# Patient Record
Sex: Female | Born: 1965
Health system: Southern US, Community
[De-identification: ages and names within clinical notes are randomized; demographics above are authoritative.]

## PROBLEM LIST (undated history)

## (undated) DIAGNOSIS — Z87442 Personal history of urinary calculi: Secondary | ICD-10-CM

## (undated) DIAGNOSIS — M1990 Unspecified osteoarthritis, unspecified site: Secondary | ICD-10-CM

## (undated) DIAGNOSIS — R519 Headache, unspecified: Secondary | ICD-10-CM

## (undated) DIAGNOSIS — Z8489 Family history of other specified conditions: Secondary | ICD-10-CM

## (undated) HISTORY — PX: DILATION AND CURETTAGE OF UTERUS: SHX78

## (undated) HISTORY — PX: COLONOSCOPY: SHX174

---

## 2000-09-26 ENCOUNTER — Other Ambulatory Visit: Admission: RE | Admit: 2000-09-26 | Discharge: 2000-09-26 | Payer: Self-pay | Admitting: Obstetrics and Gynecology

## 2000-12-19 ENCOUNTER — Encounter: Admission: RE | Admit: 2000-12-19 | Discharge: 2000-12-19 | Payer: Self-pay | Admitting: Internal Medicine

## 2001-05-24 ENCOUNTER — Ambulatory Visit (HOSPITAL_COMMUNITY): Admission: RE | Admit: 2001-05-24 | Discharge: 2001-05-24 | Payer: Self-pay | Admitting: Obstetrics and Gynecology

## 2001-05-24 ENCOUNTER — Encounter: Payer: Self-pay | Admitting: Obstetrics and Gynecology

## 2001-10-04 ENCOUNTER — Other Ambulatory Visit: Admission: RE | Admit: 2001-10-04 | Discharge: 2001-10-04 | Payer: Self-pay | Admitting: Obstetrics and Gynecology

## 2001-11-16 ENCOUNTER — Encounter: Payer: Self-pay | Admitting: Obstetrics and Gynecology

## 2001-11-16 ENCOUNTER — Ambulatory Visit (HOSPITAL_COMMUNITY): Admission: RE | Admit: 2001-11-16 | Discharge: 2001-11-16 | Payer: Self-pay | Admitting: Obstetrics and Gynecology

## 2001-11-24 ENCOUNTER — Ambulatory Visit (HOSPITAL_COMMUNITY): Admission: RE | Admit: 2001-11-24 | Discharge: 2001-11-24 | Payer: Self-pay | Admitting: Obstetrics and Gynecology

## 2001-11-24 ENCOUNTER — Encounter: Payer: Self-pay | Admitting: Obstetrics and Gynecology

## 2002-12-21 ENCOUNTER — Encounter: Admission: RE | Admit: 2002-12-21 | Discharge: 2002-12-21 | Payer: Self-pay | Admitting: Internal Medicine

## 2004-02-26 ENCOUNTER — Other Ambulatory Visit: Admission: RE | Admit: 2004-02-26 | Discharge: 2004-02-26 | Payer: Self-pay | Admitting: Obstetrics and Gynecology

## 2004-05-11 ENCOUNTER — Ambulatory Visit (HOSPITAL_COMMUNITY): Admission: RE | Admit: 2004-05-11 | Discharge: 2004-05-11 | Payer: Self-pay | Admitting: Obstetrics and Gynecology

## 2004-10-18 ENCOUNTER — Inpatient Hospital Stay (HOSPITAL_COMMUNITY): Admission: AD | Admit: 2004-10-18 | Discharge: 2004-10-20 | Payer: Self-pay | Admitting: Obstetrics and Gynecology

## 2008-05-14 ENCOUNTER — Ambulatory Visit (HOSPITAL_COMMUNITY): Admission: RE | Admit: 2008-05-14 | Discharge: 2008-05-14 | Payer: Self-pay | Admitting: Obstetrics and Gynecology

## 2009-05-08 ENCOUNTER — Ambulatory Visit: Payer: Self-pay | Admitting: Sports Medicine

## 2009-05-08 DIAGNOSIS — M25561 Pain in right knee: Secondary | ICD-10-CM

## 2009-05-08 DIAGNOSIS — M25562 Pain in left knee: Secondary | ICD-10-CM

## 2010-11-09 ENCOUNTER — Ambulatory Visit (HOSPITAL_COMMUNITY)
Admission: RE | Admit: 2010-11-09 | Discharge: 2010-11-09 | Payer: Self-pay | Source: Home / Self Care | Attending: Obstetrics and Gynecology | Admitting: Obstetrics and Gynecology
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
## 2011-04-09 NOTE — Op Note (Signed)
[facility]  Patient:    Jennifer Casey, Jennifer Casey Visit Number: [id number] MRN: [id number]          Service Type: Attending:  Maris Berger. Pennie Rushing, M.D. Dictated by:   Maris Berger. Pennie Rushing, M.D. Proc. Date: 11/24/01                             Operative Report  PREOPERATIVE DIAGNOSES:       Intrauterine pregnancy at [redacted] weeks gestation, maternal age 45, abnormal AFP.  POSTOPERATIVE DIAGNOSES:      Intrauterine pregnancy at [redacted] weeks gestation, maternal age 33, abnormal AFP.  OPERATION:                    Genetic amniocentesis.  ANESTHESIA:                   Local.  ESTIMATED BLOOD LOSS:         Less than 5 cc.  COMPLICATIONS:                None.  FINDINGS:                     There was a posterior placenta and normal amniotic fluid.  A detailed ultrasound had been performed on November 16, 2001 showing normal anatomy.  PROCEDURE:                    A discussion had been held with the patient and her husband concerning the indications for her procedure as well as the risks involved on December 26 when an attempted amniocentesis was undertaken.  The patient presents today for repeat attempt at amniocentesis.  A preliminary screening ultrasound is performed and a pocket of fluid that is devoid of fetal parts and cord was identified which could be accessed to the left of midline at the level of the umbilicus.  This area was prepped with multiple layers of Betadine and draped as a sterile field.  Infiltration with 5 cc of 1% Xylocaine was undertaken.  A 20 gauge spinal needle was used to access the amniotic fluid pocket on a single stick and 5 cc of clear yellow fluid was removed into the first syringe and 10 cc of clear fluid into a second syringe. The pre amniocentesis heart rate was 140 beats per minute.  The post amniocentesis heart rate was noted to be 143 beats per minute.  The amniocentesis site was documented by ultrasound and the amniocentesis  needle removed.  The patient tolerated the procedure well.  The fluid was sent to [facility] for evaluation.  The patient was given written instructions concerning post amniocentesis follow-up. Dictated by:   Maris Berger. Pennie Rushing, M.D. Attending:  Maris Berger. Pennie Rushing, M.D. DD:  11/24/01 TD:  11/24/01 Job: [id number]

## 2011-04-09 NOTE — Op Note (Signed)
[facility]  Patient:    Jennifer Casey, Jennifer Casey Visit Number: [id number] MRN: [id number]          Service Type: Attending:  Maris Berger. Pennie Rushing, M.D. Dictated by:   Maris Berger. Pennie Rushing, M.D. Proc. Date: 11/16/01                             Operative Report  PREOPERATIVE DIAGNOSIS:       Intrauterine pregnancy at [redacted] weeks gestation, abnormal alpha-fetoprotein, increased Downs syndrome risk, maternal age 57.  POSTOPERATIVE DIAGNOSIS:      Intrauterine pregnancy at [redacted] weeks gestation, abnormal alpha-fetoprotein, increased Downs syndrome risk, maternal age 45.  OPERATION:                    Attempted amniocentesis for genetic purposes but without the procurement of amniotic fluid.  SURGEON:                      Vanessa P. Pennie Rushing, M.D.  ANESTHESIA:                   Local.  ESTIMATED BLOOD LOSS:         Less than 10 cc.  COMPLICATIONS:                Tenting of the amnion which precluded the entry into the amniotic fluid space.  DESCRIPTION OF PROCEDURE:     The patient underwent an alpha-fetoprotein in our office on 11/01/01.  It returned with increased Downs syndrome risk of 1 in 195.  A long discussion was held with the patient and her husband concerning the implications of this finding as well as the recommendation for amniocentesis based on the patients age alone; then with an increase in the age-related risk of 1 in 380 to 1 in 195 based on her alpha-fetoprotein and free beta hCG.  A detailed ultrasound was performed and a posterior placenta documented.  An adequate fluid pocket just to the right of midline, approximately 3 cm above the pubis was identified.  The area was cleansed with Betadine and infiltrated with 1% Xylocaine.  Using a 20-gauge Ultraview needle, the aforementioned amniotic sac space was sought after but never entered.  Visualization in real time ultrasound showed the tip of the needle to be directly adjacent to the fluid pocket;  however, no fluid egressed. After trying to position the needle on several occasions, but finding the fetus too close to the needle track, a decision was made to remove that needle and to reevaluate the uterus for another fluid pocket.  This was done and fluid pocket identified to the left of midline and slightly above the previously identified spot.  This area again was cleansed with Betadine and infiltrated with 1% Xylocaine.  Using a 20-gauge Ultraview needle, the attempt was made to access that pocket; however, even though the real time ultrasound documented that the needle was directly adjacent to the amniotic fluid pocket, a membrane could be visualized which tented down into the amniotic sac. Attempts to break through that membrane into the amniotic cavity were unsuccessful and a decision was made to end the procedure with removal of the amniocentesis needle after documentation of its placement was done on ultrasound.  At that time a discussion was held with the patient and her husband concerning rescheduling the amniocentesis for 11/24/01 at 7 a.m. and this was agreeable to them.  The post amniocentesis  heart rate was within the normal limit of 120-160.  There were no immediate complications of the two attempts at amniocentesis.  Blood type is A positive. Dictated by:   Maris Berger. Pennie Rushing, M.D. Attending:  Maris Berger. Pennie Rushing, M.D. DD:  11/16/01 TD:  11/17/01 Job: [id number]

## 2011-12-21 ENCOUNTER — Other Ambulatory Visit (HOSPITAL_COMMUNITY): Payer: Self-pay | Admitting: Obstetrics and Gynecology

## 2011-12-21 DIAGNOSIS — Z1231 Encounter for screening mammogram for malignant neoplasm of breast: Secondary | ICD-10-CM

## 2012-01-28 ENCOUNTER — Ambulatory Visit (HOSPITAL_COMMUNITY)
Admission: RE | Admit: 2012-01-28 | Discharge: 2012-01-28 | Disposition: A | Discharge status: Home / Self Care | Payer: 59 | Source: Ambulatory Visit | Attending: Obstetrics and Gynecology | Admitting: Obstetrics and Gynecology

## 2012-01-28 DIAGNOSIS — Z1231 Encounter for screening mammogram for malignant neoplasm of breast: Secondary | ICD-10-CM | POA: Insufficient documentation

## 2013-07-04 ENCOUNTER — Other Ambulatory Visit: Payer: Self-pay | Admitting: Obstetrics and Gynecology

## 2013-07-04 DIAGNOSIS — Z1231 Encounter for screening mammogram for malignant neoplasm of breast: Secondary | ICD-10-CM

## 2013-07-18 ENCOUNTER — Ambulatory Visit (HOSPITAL_COMMUNITY)
Admission: RE | Admit: 2013-07-18 | Discharge: 2013-07-18 | Disposition: A | Discharge status: Home / Self Care | Payer: 59 | Source: Ambulatory Visit | Attending: Obstetrics and Gynecology | Admitting: Obstetrics and Gynecology

## 2013-07-18 ENCOUNTER — Other Ambulatory Visit: Payer: Self-pay | Admitting: Obstetrics and Gynecology

## 2013-07-18 DIAGNOSIS — Z1231 Encounter for screening mammogram for malignant neoplasm of breast: Secondary | ICD-10-CM | POA: Insufficient documentation

## 2015-04-08 ENCOUNTER — Other Ambulatory Visit (HOSPITAL_COMMUNITY): Payer: Self-pay | Admitting: Obstetrics and Gynecology

## 2015-04-08 DIAGNOSIS — Z1231 Encounter for screening mammogram for malignant neoplasm of breast: Secondary | ICD-10-CM

## 2015-04-09 ENCOUNTER — Encounter: Payer: Self-pay | Admitting: Sports Medicine

## 2015-04-09 ENCOUNTER — Ambulatory Visit (INDEPENDENT_AMBULATORY_CARE_PROVIDER_SITE_OTHER): Payer: 59 | Admitting: Sports Medicine

## 2015-04-09 VITALS — BP 118/66 | Ht 59.0 in | Wt 170.0 lb

## 2015-04-09 DIAGNOSIS — M7522 Bicipital tendinitis, left shoulder: Secondary | ICD-10-CM

## 2015-04-09 DIAGNOSIS — M7051 Other bursitis of knee, right knee: Secondary | ICD-10-CM | POA: Diagnosis not present

## 2015-04-09 DIAGNOSIS — M7052 Other bursitis of knee, left knee: Secondary | ICD-10-CM | POA: Diagnosis not present

## 2015-04-09 MED ORDER — METHYLPREDNISOLONE 4 MG PO TBPK: ORAL_TABLET | Freq: Every morning | ORAL | Status: DC

## 2015-04-09 MED ORDER — NITROGLYCERIN 0.2 MG/HR TD PT24: MEDICATED_PATCH | TRANSDERMAL | Status: DC

## 2015-04-09 NOTE — Patient Instructions (Signed)
It was nice to meet you. Please email me the pictures on your bike.  Review of exercises start with 2 sets of 10 per day and working her way up to 3 sets of 15.   Take the Medrol Dosepak beginning next week prior to your trip.   Nitroglycerin Protocol  Apply 1/4 nitroglycerin patch to affected area daily.  Change position of patch within the affected area every 24 hours.  You may experience a headache during the first 1-2 weeks of using the patch, these should subside.  If you experience headaches after beginning nitroglycerin patch treatment, you may take your preferred over the counter pain reliever.  Another side effect of the nitroglycerin patch is skin irritation or rash related to patch adhesive.  Please notify our office if you develop more severe headaches or rash, and stop the patch.  Tendon healing with nitroglycerin patch may require 12 to 24 weeks depending on the extent of injury.  Men should not use if taking Viagra, Cialis, or Levitra.   Do not use if you have migraines or rosacea.

## 2015-04-09 NOTE — Progress Notes (Signed)
DOSHA BROSHEARS - 49 y.o. female MRN [id number]  Date of birth: [birth year]  SUBJECTIVE: CC: 1. Medial knee pain, bilateral 2. Left anterior shoulder pain      HPI:   persistent long-standing medial knee pain that is worsened with cycling.  Patient purchased a new bike and was fit at the [employer] store last year and reported minimal improvement in her symptoms. She is unable to stand bike. Significant medial knee pain and weakness.  She denies any numbness or tingling or pain radiating down her legs. Pain is focally located over bilateral medial knees.  Left shoulder has been bothering her for 2-3 months. She's tried taking over-the-counter anti-inflammatories with only minimal improvement. This did seem to help bilateral knee pain short-lived.  Left arm pain does not radiate down her arm. It is worse with overhead motion lifting straight out in front of her.   She has been working with a Physiological scientist as sometimes exacerbates both her symptoms.      ROS:   denies fevers, chills, night sweats or weight loss   HISTORY:  Past Medical, Surgical, Social, and Family History reviewed & updated per EMR.  Pertinent Historical Findings include: Social History   Occupational History  . Not on file.   Social History Main Topics  . Smoking status: Never Smoker   . Smokeless tobacco: Not on file  . Alcohol Use: Not on file  . Drug Use: Not on file  . Sexual Activity: Not on file    No specialty comments available. Problem  Biceps Tendinitis  Pes Anserinus Bursitis of Both Knees   OBJECTIVE:  VS:   HT:4\' 11"  (149.9 cm)   WT:170 lb (77.111 kg)  BMI:34.4          BP:118/66 mmHg  HR: bpm  TEMP: ( )  RESP:   PHYSICAL EXAM: GENERAL:  adult Caucasian female no acute distress  PSYCH: Alert and appropriately interactive.  SKIN: No open skin lesions or abnormal skin markings on areas inspected as below  VASCULAR:  DP and PT pulses 2+/4. Bilateral radial pulses 2+/4. No significant  pretibial edema.   NEURO: Lower extremity strength is 5+/5 in all myotomes; sensation is intact to light touch in all dermatomes. Upper extremity strength is 5+/5 in all myotomes; sensation is intact to light touch in all dermatomes. Bilateral negative Spurling's compression test. Negative Lhermitte's compression test   LEFT SHOULDER: Overall well aligned. TTP over bicipital groove. No atrophy. Full overhead range of motion full and symmetric internal rotation and external rotation. Negative empty can test, Luan Pulling, Neers. Positive speeds. Negative Yergason's. Minimal pain with resisted supination. No distal biceps tenderness BILATERAL KNEES: Overall well aligned. VMOs poorly defined. Extensor mechanism intact. No effusion. ROM: 0-120 Stable to varus and valgus strain, anterior posterior drawer. Marked tenderness palpation over bilateral medial knees. No significant joint line tenderness. Negative McMurray's.  DATA OBTAINED: No notes on file LIMITED MSK ULTRASOUND OF LEFT SHOULDER: Findings:  Biceps tendon with hypoechoic fluid surrounding it. No significant splitting. Subscapularis intact, supraspinatus intact, infraspinatus and teres minor intact. No significant AC joint arthropathy. Impression: Biceps tendinopathy  ASSESSMENT & PLAN: See problem based charting & AVS for additional documentation Problem List Items Addressed This Visit    Pes anserinus bursitis of both knees - Primary    Chronic, long-standing. Only minimal improvement with recent bike fit the was recommended she increase her saddle height by over a centimeter. They're slowly increasing her leg. Recommend further increasing her seat height and  likely shifting her saddle forward.  She will email me a picture of her bike position to see if I can make any further recommendations. > Consider: Fit at E3 Steroid dose pack for her upcoming [place] trip.  HEP: Quad sets, hamstring strengthening, adductor strengthening.      Relevant  Medications   methylPREDNISolone (MEDROL DOSEPAK) 4 MG TBPK tablet   nitroGLYCERIN (NITRODUR - DOSED IN MG/24 HR) 0.2 mg/hr patch   Biceps tendinitis    Ultrasound evidence of biceps tendinopathy. Chronic. Nitroglycerin protocol. Discussed history of migraine headaches and discontinuation if any difficulties. Palm up wagon wheel exercises, supination and pronation exercises. Repeat scan in 6 weeks.          FOLLOW UP:  Return in about 6 weeks (around 05/21/2015).

## 2015-04-10 DIAGNOSIS — M7520 Bicipital tendinitis, unspecified shoulder: Secondary | ICD-10-CM | POA: Insufficient documentation

## 2015-04-10 NOTE — Assessment & Plan Note (Signed)
Ultrasound evidence of biceps tendinopathy. Chronic. Nitroglycerin protocol. Discussed history of migraine headaches and discontinuation if any difficulties. Palm up wagon wheel exercises, supination and pronation exercises. Repeat scan in 6 weeks.

## 2015-04-10 NOTE — Assessment & Plan Note (Addendum)
Chronic, long-standing. Only minimal improvement with recent bike fit the was recommended she increase her saddle height by over a centimeter. They're slowly increasing her leg. Recommend further increasing her seat height and likely shifting her saddle forward.  She will email me a picture of her bike position to see if I can make any further recommendations. > Consider: Fit at E3 Steroid dose pack for her upcoming [place] trip.  HEP: Quad sets, hamstring strengthening, adductor strengthening.

## 2015-04-11 ENCOUNTER — Ambulatory Visit (HOSPITAL_COMMUNITY)
Admission: RE | Admit: 2015-04-11 | Discharge: 2015-04-11 | Disposition: A | Discharge status: Home / Self Care | Payer: 59 | Source: Ambulatory Visit | Attending: Obstetrics and Gynecology | Admitting: Obstetrics and Gynecology

## 2015-04-11 DIAGNOSIS — Z1231 Encounter for screening mammogram for malignant neoplasm of breast: Secondary | ICD-10-CM | POA: Diagnosis present

## 2015-05-21 ENCOUNTER — Ambulatory Visit (INDEPENDENT_AMBULATORY_CARE_PROVIDER_SITE_OTHER): Payer: 59 | Admitting: Sports Medicine

## 2015-05-21 ENCOUNTER — Encounter: Payer: Self-pay | Admitting: Sports Medicine

## 2015-05-21 VITALS — BP 142/80

## 2015-05-21 DIAGNOSIS — M7052 Other bursitis of knee, left knee: Secondary | ICD-10-CM | POA: Diagnosis not present

## 2015-05-21 DIAGNOSIS — M7522 Bicipital tendinitis, left shoulder: Secondary | ICD-10-CM

## 2015-05-21 DIAGNOSIS — M7051 Other bursitis of knee, right knee: Secondary | ICD-10-CM | POA: Diagnosis not present

## 2015-05-21 MED ORDER — MELOXICAM 15 MG PO TABS: 15.0000 mg | ORAL_TABLET | Freq: Every day | ORAL | Status: DC | PRN

## 2015-05-21 NOTE — Patient Instructions (Signed)
It was good to see you.  Changes to exercises: Palm up wagon wheel @ 0, 45, 90degrees with 2-3# weight Hip ADDuction exercises - side laying - up and hold while laying completely flat  Watch your BP and decrease the Meloxicam to as needed - hopefully 2-3Xs/week at the most

## 2015-05-23 NOTE — Progress Notes (Signed)
Jennifer Casey - 49 y.o. female MRN [id number]  Date of birth: 1966/03/24  VS:   HT:    WT:   BMI:           BP:(!) 142/80 mmHg  HR: bpm  TEMP: ( )  RESP:    Dr. Algis Downs Wife CHIEF COMPLAINT: #1 bilateral knee pain #2 left shoulder pain #3 Elevated Blood pressure issues   HISTORY OF PRESENT ILLNESS: Overall both knees as well as left shoulder significant improvement since her last office visit. She's been performing therapeutic exercises for her left shoulder as well as bilateral knees and is reported marked improvement. She tolerated her trip to [place] well and was able to ride the majority of today's without significant difficulty or discomfort. She did not take prednisone dosepak prescribed but has been taking meloxicam once daily for the past 3 weeks and reports significant help with this. She has had recent travel with her daughter bilateral lower extremity swelling as well as been on meloxicam. Blood pressure is elevated today but denies any chest pain shortness breath or new or different symptoms. She's not had issues with blood pressure in the past.  REVIEW OF SYSTEMS: Review of systems per history of present illness  HISTORY: Reviewed per HPI Non smoker  PHYSICAL EXAM: Objective bilateral knees overall well aligned she has a trace effusion bilaterally pain over the medial joint line as well as bilateral pain over pes bursa. Slight pain with bilateral patellar grind. No significant patellar facet pain no significant patellar tendon pain. Overall normal range of motion She does have pain with resisted hip Adduction exercises that localizes to the area she is sore over the bilateral medial knees. Left shoulder overall well aligned minimal pain pain over the anterior aspect of the shoulder bicipital groove. Pain with resisted biceps testing no pain with Yergason's Neers or O'Brien's testing. Strength is 5 out of 5.  ASSESSMENT: Encounter Diagnoses  Name Primary?  . Pes  anserinus bursitis of both knees Yes  . Biceps tendinitis, left     #1 Bilateral knee pain consistent with bilateral basilar answered bursitis significantly improved since last office visit #2 Left biceps tendinitis/tendinopathy  PLAN: For her left shoulder with her continue with nitroglycerin protocol and therapeutic exercises. We'll have her modified using Palm up exercises she's been doing palm down. The wrist supination/pronation exercises seem to exacerbate her wrist pain so we will have her hold off on this.  For her bilateral knees did review hip adduction exercises with her today. I do suspect some this is coming from bike fit position and underlying muscle imbalances that she is improving and she will try to send picture again, from a more direct approach from the side to check for knee over spindle placement as I suspect she may be too far backwards however it is difficult to tell from pictures previously sent.  Blood pressure is slightly elevated upon recheck it was minimally improved. This is likely due to anti-inflammatory effects and have recommended she decreased to taking this on a when necessary basis and watching her sodium intake. If she has persistent elevation she's discontinue anti-inflammatory to follow-up with her PCP for further evaluation. She will follow-up with sports medicine clinic or myself [facility] in 6-8 weeks for clinical recheck and consideration of formal bike fit analysis at that time.  > Return in about 6 weeks (around 07/02/2015).   Meds ordered this encounter  Medications  . meloxicam (MOBIC) 15 MG tablet  Sig: Take 1 tablet (15 mg total) by mouth daily as needed for pain.    Dispense:  30 tablet    Refill:  1    No orders of the defined types were placed in this encounter.

## 2015-10-08 ENCOUNTER — Ambulatory Visit (INDEPENDENT_AMBULATORY_CARE_PROVIDER_SITE_OTHER): Payer: 59 | Admitting: Sports Medicine

## 2015-10-08 ENCOUNTER — Encounter: Payer: Self-pay | Admitting: Sports Medicine

## 2015-10-08 VITALS — BP 112/57 | HR 81 | Ht 59.0 in | Wt 170.0 lb

## 2015-10-08 DIAGNOSIS — R269 Unspecified abnormalities of gait and mobility: Secondary | ICD-10-CM | POA: Insufficient documentation

## 2015-10-08 DIAGNOSIS — M7052 Other bursitis of knee, left knee: Secondary | ICD-10-CM | POA: Diagnosis not present

## 2015-10-08 DIAGNOSIS — M25562 Pain in left knee: Secondary | ICD-10-CM | POA: Diagnosis not present

## 2015-10-08 DIAGNOSIS — M25561 Pain in right knee: Secondary | ICD-10-CM

## 2015-10-08 DIAGNOSIS — M7051 Other bursitis of knee, right knee: Secondary | ICD-10-CM

## 2015-10-08 NOTE — Assessment & Plan Note (Addendum)
The bilateral knee pain I think is biomechanical She has dynamic genu valgus related to her foot breakdown and her gait abnormality  She should use topical Voltaren at least last daily Occasional ibuprofen since too much bumps her blood pressure  Try a compression sleeve on her knee for support and use bilaterally if helpful

## 2015-10-08 NOTE — Patient Instructions (Signed)
This is triggered by biomechanical change You do not have weakness of the muscle groups  Your flattened arch puts stress on the medial knee and creates pes anserine bursitis or tendinitis  We will put arch support in shoes and cont to wear regular shoes with good arch  Use the voltaren gel consistently at least twice daily  Use the compression sleeve for hiking or prolonged standing to add some support - at the gym - but not necessary with biking  Biking helps so don't avoid this but check to see that you are in neutral position  On days you aggravate add the oral ibuprofen  If compression helps you may want to get a second one

## 2015-10-08 NOTE — Assessment & Plan Note (Signed)
I added scaphoid pads  We should try these and also use shoes with good arches  This does improve her gait  If these do not help enough we can move custom orthotics

## 2015-10-08 NOTE — Progress Notes (Signed)
Patient ID: Jennifer Casey, female   DOB: 1965/12/02, 49 y.o.   MRN: [id number]  Patient returns for her bilateral knee pain This was felt to be related to pes anserine bursitis Ultrasound scans were not that remarkable She has been resting from biking but that did not improve the knee pain Adjustment of bike seat did not make a change as well  The left shoulder has improved some She continues working with Physiological scientist  Of interest she got almost complete relief of her medial knee pain when she took the Mobic However this increased her blood pressure and cause some ankle swelling so she had to stop  Review of systems No fever or chills No recent infections No swelling of any joints No skin rashes  Physical exam Pleasant lady in no acute distress BP 112/57 mmHg  Pulse 81  Ht 4\' 11"  (1.499 m)  Wt 170 lb (77.111 kg)  BMI 34.32 kg/m2  Hip abduction strength is good Quadriceps strength is good  Knee Left and right Normal to inspection with no erythema or effusion or obvious bony abnormalities. Palpation normal with no warmth or joint line tenderness or patellar tenderness or condyle tenderness. ROM normal in flexion and extension and lower leg rotation. Ligaments with solid consistent endpoints including ACL, PCL, LCL, MCL. Negative Mcmurray's and provocative meniscal tests. Non painful patellar compression. Patellar and quadriceps tendons unremarkable. Hamstring and quadriceps strength is normal.  Standing she has collapse of the longitudinal arch and significant pronation She gets a drop of her midfoot  On walking gait and this gives her dynamic genu valgus and internal rotation of the upper tibia

## 2015-11-12 ENCOUNTER — Other Ambulatory Visit: Payer: Self-pay | Admitting: *Deleted

## 2015-11-12 MED ORDER — KETOPROFEN POWD: Status: DC

## 2016-04-22 DIAGNOSIS — Z1231 Encounter for screening mammogram for malignant neoplasm of breast: Secondary | ICD-10-CM | POA: Diagnosis not present

## 2016-04-22 DIAGNOSIS — N904 Leukoplakia of vulva: Secondary | ICD-10-CM | POA: Diagnosis not present

## 2016-04-22 DIAGNOSIS — Z01411 Encounter for gynecological examination (general) (routine) with abnormal findings: Secondary | ICD-10-CM | POA: Diagnosis not present

## 2016-04-29 ENCOUNTER — Encounter: Payer: Self-pay | Admitting: Sports Medicine

## 2016-04-29 ENCOUNTER — Ambulatory Visit (INDEPENDENT_AMBULATORY_CARE_PROVIDER_SITE_OTHER): Payer: 59 | Admitting: Sports Medicine

## 2016-04-29 VITALS — BP 112/96 | HR 75 | Ht 59.0 in | Wt 175.0 lb

## 2016-04-29 DIAGNOSIS — M25561 Pain in right knee: Secondary | ICD-10-CM

## 2016-04-29 DIAGNOSIS — M25562 Pain in left knee: Secondary | ICD-10-CM

## 2016-04-29 NOTE — Assessment & Plan Note (Signed)
Bilateral knee pain seems biomechanical Proper bike fit is key Advised on exercises to strengthen quads (see AVS) Likely with mild early arthritic changes noted on medial femoral condyles that are irritated by crossed knee sitting Advised more frequent position changes F/u prn

## 2016-04-29 NOTE — Patient Instructions (Signed)
Try Arnica gel instead of ketoprofen.  Exercises to strengthen quads and stabilize patella - wall sits - mini squats on each leg - 15 reps  Switch up sitting positions at least every 2 hours  Make sure bike fit is right and not hurting knees.

## 2016-04-29 NOTE — Progress Notes (Signed)
  Subjective:   Jennifer Casey is a 50 y.o. female with a history of b/l knee pain here for f/u of knee pain.  Patient was last seen 09/2015 and knee pain was thought to be related to mechanical strain and pes anserine bursitis.  [place] scans previously unremarkable.  Patient reports that since having bike fit adjusted 3-4 weeks ago, knee pain has greatly improved.  She feels as though b/l feet were inverting while she was biking and putting strain on knees. Feet are now locked in straight on bike.  She is currently biking 20 mi/ride and has ridden ~8 times in last 3 wks. During her last 3 rides, her knees have not hurt at all.  She is still intermittently waking in the middle of the night with b/l knee pain. Knee pain continues to be in b/l medial knees.    Pain is worse after sitting with legs crossed for multiple hours when working from home.  She was using ketoprofen cream on b/l knees for ~6 months and it was helping.  After picking up a new batch, she noted red, itchy rash over both knees within 1 hr of applying the cream.  Pharmacist told her there was no change in products to make this batch.  Of note, she took Mobic in the past and had complete relief of knee pain, but stopped it after having ankle swelling and increased BP.  Review of Systems:  Per HPI. No fevers, chills, swelling, rash  Social History: never smoker  Objective:  BP 112/96 mmHg  Pulse 75  Ht 4\' 11"  (1.499 m)  Wt 175 lb (79.379 kg)  BMI 35.33 kg/m2  Gen:  50 y.o. female in NAD, pleasant, well appearing MSK: L and R knee: Normal to inspection with no erythema or effusion or obvious bony abnormalities. Palpation normal with no warmth. Mild TTP over medial joint lines ROM normal in flexion and extension and lower leg rotation. Ligaments with solid consistent endpoints including ACL, PCL, LCL, MCL. Negative Mcmurray's and provocative meniscal tests. Non painful patellar compression. Patellar and quadriceps tendons  unremarkable. Hamstring and quadriceps strength is normal. Normal hip abduction and adduction strength. No gross sensory defects With single leg squats, dynamic genu valgus deformity noted with pain noted in medial knee  [place]: small bone spurs noted on R and L medial femoral condyles. Menisci intact. No other abnormalities      Assessment & Plan:     Jennifer Casey is a 50 y.o. female here for  Knee pain, bilateral Bilateral knee pain seems biomechanical Proper bike fit is key Advised on exercises to strengthen quads (see AVS) Likely with mild early arthritic changes noted on medial femoral condyles that are irritated by crossed knee sitting Advised more frequent position changes F/u prn       Virginia Crews, MD MPH PGY-2,  [facility] 04/29/2016  2:16 PM     Agree with assessment and plan.  Ila Mcgill, MD

## 2016-07-28 DIAGNOSIS — M79645 Pain in left finger(s): Secondary | ICD-10-CM | POA: Diagnosis not present

## 2016-08-19 ENCOUNTER — Other Ambulatory Visit: Payer: Self-pay | Admitting: Sports Medicine

## 2016-10-25 DIAGNOSIS — L900 Lichen sclerosus et atrophicus: Secondary | ICD-10-CM | POA: Insufficient documentation

## 2016-10-25 DIAGNOSIS — N904 Leukoplakia of vulva: Secondary | ICD-10-CM | POA: Diagnosis not present

## 2016-11-01 MED FILL — CLOBETASOL 0.05% OINTMENT: 0.05 | 20 days supply | Qty: 15 | Fill #0

## 2016-12-20 DIAGNOSIS — Z0000 Encounter for general adult medical examination without abnormal findings: Secondary | ICD-10-CM | POA: Diagnosis not present

## 2016-12-20 DIAGNOSIS — N904 Leukoplakia of vulva: Secondary | ICD-10-CM | POA: Diagnosis not present

## 2017-04-12 MED FILL — GAVILYTE-N SOLUTION: 420 | 2 days supply | Qty: 4000 | Fill #0

## 2017-04-14 DIAGNOSIS — Z1211 Encounter for screening for malignant neoplasm of colon: Secondary | ICD-10-CM | POA: Diagnosis not present

## 2017-04-27 DIAGNOSIS — Z01411 Encounter for gynecological examination (general) (routine) with abnormal findings: Secondary | ICD-10-CM | POA: Diagnosis not present

## 2017-04-27 DIAGNOSIS — L900 Lichen sclerosus et atrophicus: Secondary | ICD-10-CM | POA: Diagnosis not present

## 2017-04-27 DIAGNOSIS — Z1231 Encounter for screening mammogram for malignant neoplasm of breast: Secondary | ICD-10-CM | POA: Diagnosis not present

## 2017-04-27 DIAGNOSIS — Z6836 Body mass index (BMI) 36.0-36.9, adult: Secondary | ICD-10-CM | POA: Diagnosis not present

## 2017-04-27 DIAGNOSIS — Z780 Asymptomatic menopausal state: Secondary | ICD-10-CM | POA: Diagnosis not present

## 2017-11-24 MED FILL — CLOBETASOL 0.05% OINTMENT: 0.05 | 7 days supply | Qty: 15 | Fill #0

## 2017-12-26 ENCOUNTER — Ambulatory Visit
Admission: RE | Admit: 2017-12-26 | Discharge: 2017-12-26 | Disposition: A | Discharge status: Home / Self Care | Payer: 59 | Source: Ambulatory Visit | Attending: Family Medicine | Admitting: Family Medicine

## 2017-12-26 ENCOUNTER — Other Ambulatory Visit: Payer: Self-pay | Admitting: Family Medicine

## 2017-12-26 DIAGNOSIS — L729 Follicular cyst of the skin and subcutaneous tissue, unspecified: Secondary | ICD-10-CM

## 2018-04-03 DIAGNOSIS — Z136 Encounter for screening for cardiovascular disorders: Secondary | ICD-10-CM | POA: Diagnosis not present

## 2018-04-03 DIAGNOSIS — Z0000 Encounter for general adult medical examination without abnormal findings: Secondary | ICD-10-CM | POA: Diagnosis not present

## 2018-04-03 DIAGNOSIS — N904 Leukoplakia of vulva: Secondary | ICD-10-CM | POA: Diagnosis not present

## 2018-04-03 DIAGNOSIS — Z131 Encounter for screening for diabetes mellitus: Secondary | ICD-10-CM | POA: Diagnosis not present

## 2018-05-18 DIAGNOSIS — Z1231 Encounter for screening mammogram for malignant neoplasm of breast: Secondary | ICD-10-CM | POA: Diagnosis not present

## 2018-05-18 DIAGNOSIS — Z01411 Encounter for gynecological examination (general) (routine) with abnormal findings: Secondary | ICD-10-CM | POA: Diagnosis not present

## 2018-05-18 DIAGNOSIS — Z6837 Body mass index (BMI) 37.0-37.9, adult: Secondary | ICD-10-CM | POA: Diagnosis not present

## 2018-05-18 DIAGNOSIS — L900 Lichen sclerosus et atrophicus: Secondary | ICD-10-CM | POA: Diagnosis not present

## 2018-11-03 DIAGNOSIS — H52203 Unspecified astigmatism, bilateral: Secondary | ICD-10-CM | POA: Diagnosis not present

## 2018-11-03 DIAGNOSIS — H524 Presbyopia: Secondary | ICD-10-CM | POA: Diagnosis not present

## 2018-11-13 DIAGNOSIS — R05 Cough: Secondary | ICD-10-CM | POA: Diagnosis not present

## 2018-11-13 DIAGNOSIS — B349 Viral infection, unspecified: Secondary | ICD-10-CM | POA: Diagnosis not present

## 2019-01-25 ENCOUNTER — Encounter: Payer: Self-pay | Admitting: Sports Medicine

## 2019-01-25 ENCOUNTER — Ambulatory Visit: Payer: 59 | Admitting: Sports Medicine

## 2019-01-25 ENCOUNTER — Ambulatory Visit: Payer: Self-pay

## 2019-01-25 VITALS — BP 136/78 | Ht 59.0 in | Wt 180.0 lb

## 2019-01-25 DIAGNOSIS — M25511 Pain in right shoulder: Secondary | ICD-10-CM

## 2019-01-25 NOTE — Progress Notes (Signed)
Jennifer Casey - 53 y.o. female MRN [id number]  Date of birth: 1966/02/19    SUBJECTIVE:      Chief Complaint: Right shoulder pain.  Bilateral knee pain  HPI:  53 year old female presents with right shoulder pain for 5 months and bilateral knee pain for 6 years. Patient reports anterior right shoulder pain.  She denies any specific injury.  She reports occasional "popping" at the anterior shoulder with movement which is somewhat new.  Her pain is made worse with bicep curls and forward flexion at the shoulder.  She has also had to alter her exercising due to this pain.  She notes some radiation into the bicep.  She gets relief with ice and Motrin.  She takes Motrin 200 mg a few times a week.  Her bilateral knee pain has been present for 6 years.  Is located over the anterior medial aspect of the knee.  She was evaluated for this initially and was felt to have peds anserine bursitis.Marland Kitchen  She was treated conservatively at that time but has that with some level of pain for the majority of the past 6 years.  She denies any knee swelling or erythema.  No locking or feelings of instability.   ROS:     See HPI  PERTINENT  PMH / PSH FH / / SH:  Past Medical, Surgical, Social, and Family History Reviewed & Updated in the EMR.    OBJECTIVE: BP 136/78   Ht 4\' 11"  (1.499 m)   Wt 180 lb (81.6 kg)   BMI 36.36 kg/m   Physical Exam:  Vital signs are reviewed.  GEN: Alert and oriented, NAD Pulm: Breathing unlabored PSY: normal mood, congruent affect  MSK: Right shoulder: No obvious deformity or asymmetry. No bruising. No swelling Tenderness over the proximal biceps tendon Full ROM in flexion, abduction, internal/external rotation NV intact distally Special Tests:  - Impingement: Neg Hawkins and Neers.  - Supraspinatus: Negative empty can.  5/5 strength - Infraspinatus/Teres: 5/5 strength with ER - Subscapularis: negative belly press, 5/5 strength with IR - Biceps tendon: Positive speeds.   Mildly positive Yergason's  MSK [place]: Ultrasound of right shoulder  BT short: normal appearance, no tears, no tenosynovitis BT long: normal appearance, intact without tears Supraspinatus tendon: normal appearance, no tears of degenerative changes, no abnormal fluid within the bursa Subscapularis tendon: normal appearance without tears or degenerative changes Infraspinatus tendon:  normal appearance without tears or degenerative changes Teres Minor tendon:  normal appearance without tears or degenerative changes GH joint: normal appearing posterior labrum AC joint: mild degenerative changes  Summary and Additional findings-mild AC joint arthritis otherwise unremarkable ultrasound of the right shoulder.  No rotator cuff pathology.  No biceps tenosynovitis.  Ultrasound and interpretation by Coralyn Helling, DO and Wolfgang Phoenix. Fields, MD   Left shoulder: No obvious deformity No tenderness over the proximal biceps tendon Full range of motion  5/5 strength with rotator cuff testing  Bilateral knees: - Inspection: no gross deformity. No swelling/effusion, erythema or bruising. Skin intact - Palpation: Tenderness bilaterally over the medial anterior knee.  Point maximal tenderness is over the peds anserine bursa bilaterally.  She does have some tenderness over the medial joint line as well - ROM: full active ROM with flexion and extension in knee  Genu recurvatum of both knees - Strength: 5/5 strength - Neuro/vasc: NV intact - Special Tests: - LIGAMENTS: negative Lachman's, no MCL or LCL laxity laterally -- MENISCUS: negative McMurray's bilaterally  MSK [place]: Limited ultrasound  of the bilateral knees: The right medial joint line shows very mild spurring.  The visualized portion of the meniscus is intact without tears or degenerative changes.  The right has anserine bursa appears slightly fluid-filled.  The left past anserine bursa appears slightly fluid-filled as well.  Both of these areas  correspond highly to the patient's area of pain.    ASSESSMENT & PLAN:  1.  Right shoulder pain-secondary to biceps tendinitis.  Ultrasound shows no focal tears or tenosynovitis.  Based on exam, patient does have symptoms consistent with tendinitis. - Patient instructed on home exercises/rehab for the biceps tendon - Continue ibuprofen as needed -Follow-up in 6 weeks  2.  Bilateral knee pain secondary to peds anserine bursitis - Patient started on home exercises/rehab for the sartorius, adductors, and semimembranosus/tendinosis. - If pain remains significant, consider steroid injections -Follow-up in 6 weeks  I observed and examined the patient with the [facility] Fellow and agree with assessment and plan.  Note reviewed and modified by me. Andreas Blower, MD

## 2019-05-15 DIAGNOSIS — E669 Obesity, unspecified: Secondary | ICD-10-CM | POA: Diagnosis not present

## 2019-05-15 DIAGNOSIS — N904 Leukoplakia of vulva: Secondary | ICD-10-CM | POA: Diagnosis not present

## 2019-05-15 DIAGNOSIS — Z0000 Encounter for general adult medical examination without abnormal findings: Secondary | ICD-10-CM | POA: Diagnosis not present

## 2019-06-12 DIAGNOSIS — L900 Lichen sclerosus et atrophicus: Secondary | ICD-10-CM | POA: Diagnosis not present

## 2019-06-12 DIAGNOSIS — Z124 Encounter for screening for malignant neoplasm of cervix: Secondary | ICD-10-CM | POA: Diagnosis not present

## 2019-06-12 DIAGNOSIS — Z6834 Body mass index (BMI) 34.0-34.9, adult: Secondary | ICD-10-CM | POA: Diagnosis not present

## 2019-06-12 DIAGNOSIS — Z0000 Encounter for general adult medical examination without abnormal findings: Secondary | ICD-10-CM | POA: Diagnosis not present

## 2019-06-12 DIAGNOSIS — Z8041 Family history of malignant neoplasm of ovary: Secondary | ICD-10-CM | POA: Diagnosis not present

## 2019-06-12 DIAGNOSIS — Z1239 Encounter for other screening for malignant neoplasm of breast: Secondary | ICD-10-CM | POA: Diagnosis not present

## 2019-06-12 DIAGNOSIS — Z1211 Encounter for screening for malignant neoplasm of colon: Secondary | ICD-10-CM | POA: Diagnosis not present

## 2019-06-12 DIAGNOSIS — Z01419 Encounter for gynecological examination (general) (routine) without abnormal findings: Secondary | ICD-10-CM | POA: Diagnosis not present

## 2019-06-12 MED FILL — CLOBETASOL PROP 0.05% OINT: 0.05 | 20 days supply | Qty: 15 | Fill #0

## 2019-06-15 DIAGNOSIS — Z1231 Encounter for screening mammogram for malignant neoplasm of breast: Secondary | ICD-10-CM | POA: Diagnosis not present

## 2019-06-15 DIAGNOSIS — Z0000 Encounter for general adult medical examination without abnormal findings: Secondary | ICD-10-CM | POA: Diagnosis not present

## 2019-06-22 MED FILL — VITAMIN D3 50000 UNIT CAPS: 1.25 MG | 56 days supply | Qty: 8 | Fill #0

## 2019-07-18 DIAGNOSIS — N83201 Unspecified ovarian cyst, right side: Secondary | ICD-10-CM | POA: Diagnosis not present

## 2019-07-18 DIAGNOSIS — Z842 Family history of other diseases of the genitourinary system: Secondary | ICD-10-CM | POA: Diagnosis not present

## 2019-07-18 DIAGNOSIS — Z8041 Family history of malignant neoplasm of ovary: Secondary | ICD-10-CM | POA: Diagnosis not present

## 2019-07-18 DIAGNOSIS — D259 Leiomyoma of uterus, unspecified: Secondary | ICD-10-CM | POA: Diagnosis not present

## 2019-07-19 DIAGNOSIS — Z8489 Family history of other specified conditions: Secondary | ICD-10-CM | POA: Insufficient documentation

## 2019-08-27 ENCOUNTER — Other Ambulatory Visit: Payer: Self-pay

## 2019-08-27 ENCOUNTER — Ambulatory Visit (INDEPENDENT_AMBULATORY_CARE_PROVIDER_SITE_OTHER): Payer: 59 | Admitting: Sports Medicine

## 2019-08-27 DIAGNOSIS — M7712 Lateral epicondylitis, left elbow: Secondary | ICD-10-CM

## 2019-08-27 DIAGNOSIS — M7710 Lateral epicondylitis, unspecified elbow: Secondary | ICD-10-CM | POA: Insufficient documentation

## 2019-08-27 NOTE — Assessment & Plan Note (Signed)
Patient with TTP along the lateral epicondyl with symptoms and ultrasound findings consistent with tennis elbow. - Cont Motrin scheduled for the next 7-10 days along with ice and activity modification - Compression sleeve as needed during activities - Home exercises given, f/u in 6-8 weeks if no improvement

## 2019-08-27 NOTE — Progress Notes (Signed)
     Subjective: HPI: Jennifer Casey is a 53 y.o. presenting to clinic today to discuss the following:  Left Elbow Pain Patient is a 53y/o female with PMH of right biceps tendonitis who presents today for lateral elbow pain that started 4-5 weeks ago. It is a sharp intermittent pain that sometimes radiates to the dorsal side of her hand. She takes Motrin as needed and ices it when it hurts which helps with her symptoms. She never felt a "pop" or "snap" and has not experienced any weakness, numbness, or tingling sensation. She has been riding her bike more lately and working with a Physiological scientist.  ROS noted in HPI.    Social History   Tobacco Use  Smoking Status Never Smoker  Smokeless Tobacco Never Used    Objective: BP 124/86   Ht 4\' 11"  (1.499 m)   Wt 170 lb (77.1 kg)   BMI 34.34 kg/m  Vitals and nursing notes reviewed  Physical Exam Elbow, Left: Patient reports TTP at the lateral epicondyle. Inspection yields no evidence of bony deformity, effusion, erythema, ecchymosis, or rash. Active and passive ROM intact in flexion/extension/supination/pronation. Strength 5/5 throughout. No TTP at the medial epicondyle. Pain with finger/wrist extension against resistance. Pain with gripping and wrist extension, no pain with wrist flexion against resistance. No evidence of pain or laxity at the UCL.  Left Elbow U/S: Hypoechoic changes of the ECRB noted at the lateral epicondyl in long axis view along with bone spur on the lateral epicondyl proximal to the joint line. No signs of ECRB tear.  Assessment/Plan:  Lateral epicondylitis (tennis elbow) Patient with TTP along the lateral epicondyl with symptoms and ultrasound findings consistent with tennis elbow. - Cont Motrin scheduled for the next 7-10 days along with ice and activity modification - Compression sleeve as needed during activities - Home exercises given, f/u in 6-8 weeks if no improvement  PATIENT EDUCATION PROVIDED: See  AVS    Diagnosis and plan along with any newly prescribed medication(s) were discussed in detail with this patient today. The patient verbalized understanding and agreed with the plan. Patient advised if symptoms worsen return to clinic or ER.    Harolyn Rutherford, DO 08/27/2019, 4:00 PM PGY-3 [facility]  Patient seen and evaluated with the resident.  I agree with the above plan of care.  Ultrasound shows hypoechoic changes within the common extensor tendon consistent with lateral epicondylitis.  Treatment as above.  If symptoms persist consider topical nitroglycerin and formal physical therapy.  Follow-up for ongoing or recalcitrant issues.

## 2019-08-27 NOTE — Patient Instructions (Signed)
Tennis Elbow Tennis elbow is swelling (inflammation) in your outer forearm, near your elbow. Swelling affects the tissues that connect muscle to bone (tendons). Tennis elbow can happen in any sport or job in which you use your elbow too much. It is caused by doing the same motion over and over. Tennis elbow can cause:  Pain and tenderness in your forearm and the outer part of your elbow. You may have pain all the time, or only when using the arm.  A burning feeling. This runs from your elbow through your arm.  Weak grip in your hand. Follow these instructions at home: Activity  Rest your elbow and wrist. Avoid activities that cause problems, as told by your doctor.  If told by your doctor, wear an elbow strap to reduce stress on the area.  Do physical therapy exercises as told.  If you lift an object, lift it with your palm facing up. This is easier on your elbow. Lifestyle  If your tennis elbow is caused by sports, check your equipment and make sure that: ? You are using it correctly. ? It fits you well.  If your tennis elbow is caused by work or by using a computer, take breaks often to stretch your arm. Talk with your manager about how you can manage your condition at work. If you have a brace:  Wear the brace as told by your doctor. Remove it only as told by your doctor.  Loosen the brace if your fingers tingle, get numb, or turn cold and blue.  Keep the brace clean.  If the brace is not waterproof, ask your doctor if you may take the brace off for bathing. If you must keep the brace on while bathing: ? Do not let it get wet. ? Cover it with a watertight covering when you take a bath or a shower. General instructions   If told, put ice on the painful area: ? Put ice in a plastic bag. ? Place a towel between your skin and the bag. ? Leave the ice on for 20 minutes, 2-3 times a day.  Take over-the-counter and prescription medicines only as told by your doctor.  Keep  all follow-up visits as told by your doctor. This is important. Contact a doctor if:  Your pain does not get better with treatment.  Your pain gets worse.  You have weakness in your forearm, hand, or fingers.  You cannot feel your forearm, hand, or fingers. Summary  Tennis elbow is swelling (inflammation) in your outer forearm, near your elbow.  Tennis elbow is caused by doing the same motion over and over.  Rest your elbow and wrist. Avoid activities that cause problems, as told by your doctor.  If told, put ice on the painful area for 20 minutes, 2-3 times a day. This information is not intended to replace advice given to you by your health care provider. Make sure you discuss any questions you have with your health care provider. Document Released: 04/28/2010 Document Revised: 08/04/2018 Document Reviewed: 08/23/2017 Elsevier Patient Education  2020 [employer]  

## 2019-10-29 DIAGNOSIS — E559 Vitamin D deficiency, unspecified: Secondary | ICD-10-CM | POA: Diagnosis not present

## 2019-10-29 DIAGNOSIS — N83201 Unspecified ovarian cyst, right side: Secondary | ICD-10-CM | POA: Diagnosis not present

## 2019-10-30 DIAGNOSIS — K130 Diseases of lips: Secondary | ICD-10-CM | POA: Diagnosis not present

## 2019-10-30 DIAGNOSIS — L308 Other specified dermatitis: Secondary | ICD-10-CM | POA: Diagnosis not present

## 2019-10-30 MED FILL — DESONIDE 0.05% OINTMENT: 0.05 | 14 days supply | Qty: 15 | Fill #0

## 2019-11-07 MED FILL — VITAMIN D3 50000 UNIT CAPS: 1.25 MG | 56 days supply | Qty: 8 | Fill #0

## 2019-12-12 DIAGNOSIS — D2272 Melanocytic nevi of left lower limb, including hip: Secondary | ICD-10-CM | POA: Diagnosis not present

## 2020-01-02 DIAGNOSIS — E559 Vitamin D deficiency, unspecified: Secondary | ICD-10-CM | POA: Diagnosis not present

## 2020-01-07 DIAGNOSIS — Z8041 Family history of malignant neoplasm of ovary: Secondary | ICD-10-CM | POA: Diagnosis not present

## 2020-01-07 DIAGNOSIS — K580 Irritable bowel syndrome with diarrhea: Secondary | ICD-10-CM | POA: Diagnosis not present

## 2020-02-06 DIAGNOSIS — K529 Noninfective gastroenteritis and colitis, unspecified: Secondary | ICD-10-CM | POA: Diagnosis not present

## 2020-05-15 DIAGNOSIS — E559 Vitamin D deficiency, unspecified: Secondary | ICD-10-CM | POA: Diagnosis not present

## 2020-05-15 DIAGNOSIS — Z131 Encounter for screening for diabetes mellitus: Secondary | ICD-10-CM | POA: Diagnosis not present

## 2020-05-15 DIAGNOSIS — Z13220 Encounter for screening for lipoid disorders: Secondary | ICD-10-CM | POA: Diagnosis not present

## 2020-05-15 DIAGNOSIS — E669 Obesity, unspecified: Secondary | ICD-10-CM | POA: Diagnosis not present

## 2020-05-15 DIAGNOSIS — Z0000 Encounter for general adult medical examination without abnormal findings: Secondary | ICD-10-CM | POA: Diagnosis not present

## 2020-08-01 DIAGNOSIS — Z23 Encounter for immunization: Secondary | ICD-10-CM | POA: Diagnosis not present

## 2020-09-25 ENCOUNTER — Other Ambulatory Visit (HOSPITAL_COMMUNITY): Payer: Self-pay | Admitting: Obstetrics and Gynecology

## 2020-09-25 DIAGNOSIS — L900 Lichen sclerosus et atrophicus: Secondary | ICD-10-CM | POA: Diagnosis not present

## 2020-09-25 DIAGNOSIS — Z1211 Encounter for screening for malignant neoplasm of colon: Secondary | ICD-10-CM | POA: Diagnosis not present

## 2020-09-25 DIAGNOSIS — Z01419 Encounter for gynecological examination (general) (routine) without abnormal findings: Secondary | ICD-10-CM | POA: Diagnosis not present

## 2020-09-25 DIAGNOSIS — Z8041 Family history of malignant neoplasm of ovary: Secondary | ICD-10-CM | POA: Diagnosis not present

## 2020-09-25 DIAGNOSIS — Z1239 Encounter for other screening for malignant neoplasm of breast: Secondary | ICD-10-CM | POA: Diagnosis not present

## 2020-09-25 MED FILL — TRIAMCINOLONE 0.1% CREAM: 0.1 | 30 days supply | Qty: 45 | Fill #0

## 2020-10-03 DIAGNOSIS — Z23 Encounter for immunization: Secondary | ICD-10-CM | POA: Diagnosis not present

## 2020-11-03 DIAGNOSIS — Z1231 Encounter for screening mammogram for malignant neoplasm of breast: Secondary | ICD-10-CM | POA: Diagnosis not present

## 2021-06-01 DIAGNOSIS — E559 Vitamin D deficiency, unspecified: Secondary | ICD-10-CM | POA: Diagnosis not present

## 2021-06-01 DIAGNOSIS — E669 Obesity, unspecified: Secondary | ICD-10-CM | POA: Diagnosis not present

## 2021-06-01 DIAGNOSIS — Z13220 Encounter for screening for lipoid disorders: Secondary | ICD-10-CM | POA: Diagnosis not present

## 2021-06-01 DIAGNOSIS — M7071 Other bursitis of hip, right hip: Secondary | ICD-10-CM | POA: Diagnosis not present

## 2021-06-01 DIAGNOSIS — Z23 Encounter for immunization: Secondary | ICD-10-CM | POA: Diagnosis not present

## 2021-06-01 DIAGNOSIS — Z0000 Encounter for general adult medical examination without abnormal findings: Secondary | ICD-10-CM | POA: Diagnosis not present

## 2021-06-22 ENCOUNTER — Other Ambulatory Visit: Payer: Self-pay | Admitting: Family Medicine

## 2021-06-22 DIAGNOSIS — R109 Unspecified abdominal pain: Secondary | ICD-10-CM | POA: Diagnosis not present

## 2021-06-22 DIAGNOSIS — R102 Pelvic and perineal pain: Secondary | ICD-10-CM

## 2021-06-25 ENCOUNTER — Other Ambulatory Visit: Payer: 59

## 2021-06-25 ENCOUNTER — Other Ambulatory Visit: Payer: Self-pay

## 2021-06-25 ENCOUNTER — Ambulatory Visit (HOSPITAL_COMMUNITY)
Admission: RE | Admit: 2021-06-25 | Discharge: 2021-06-25 | Disposition: A | Discharge status: Home / Self Care | Payer: 59 | Source: Ambulatory Visit | Attending: Family Medicine | Admitting: Family Medicine

## 2021-06-25 DIAGNOSIS — R102 Pelvic and perineal pain: Secondary | ICD-10-CM

## 2021-06-25 DIAGNOSIS — R109 Unspecified abdominal pain: Secondary | ICD-10-CM | POA: Insufficient documentation

## 2021-06-25 DIAGNOSIS — N858 Other specified noninflammatory disorders of uterus: Secondary | ICD-10-CM | POA: Diagnosis not present

## 2021-06-25 DIAGNOSIS — K7689 Other specified diseases of liver: Secondary | ICD-10-CM | POA: Diagnosis not present

## 2021-06-25 DIAGNOSIS — D251 Intramural leiomyoma of uterus: Secondary | ICD-10-CM | POA: Diagnosis not present

## 2021-07-07 ENCOUNTER — Other Ambulatory Visit: Payer: 59

## 2021-07-20 DIAGNOSIS — K760 Fatty (change of) liver, not elsewhere classified: Secondary | ICD-10-CM | POA: Diagnosis not present

## 2021-08-19 DIAGNOSIS — D2261 Melanocytic nevi of right upper limb, including shoulder: Secondary | ICD-10-CM | POA: Diagnosis not present

## 2021-08-19 DIAGNOSIS — L918 Other hypertrophic disorders of the skin: Secondary | ICD-10-CM | POA: Diagnosis not present

## 2021-08-19 DIAGNOSIS — L814 Other melanin hyperpigmentation: Secondary | ICD-10-CM | POA: Diagnosis not present

## 2021-08-19 DIAGNOSIS — D2271 Melanocytic nevi of right lower limb, including hip: Secondary | ICD-10-CM | POA: Diagnosis not present

## 2021-09-10 DIAGNOSIS — F419 Anxiety disorder, unspecified: Secondary | ICD-10-CM | POA: Diagnosis not present

## 2021-09-23 DIAGNOSIS — F419 Anxiety disorder, unspecified: Secondary | ICD-10-CM | POA: Diagnosis not present

## 2021-11-11 IMAGING — US US ABDOMEN COMPLETE
1 series · 15 of 25 positions shown · non-contrast
Comparison: None.

CLINICAL DATA: Abdominal pain. Palpable nodule in the right upper
quadrant.

EXAM:
ABDOMEN ULTRASOUND COMPLETE

[Series 1: us abdomen complete mc & wl · 15 of 123 slices shown]
[im 1/123]
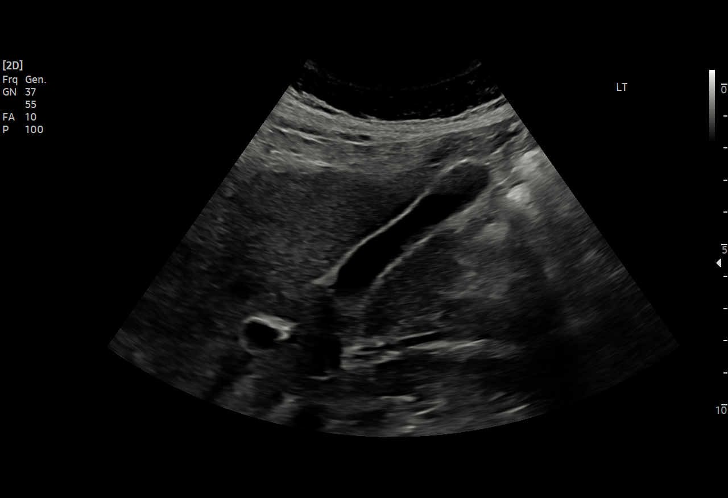
[im 11/123]
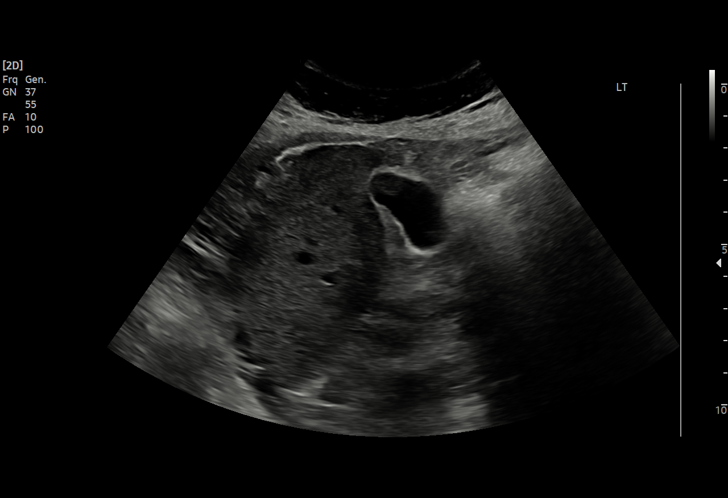
[im 21/123]
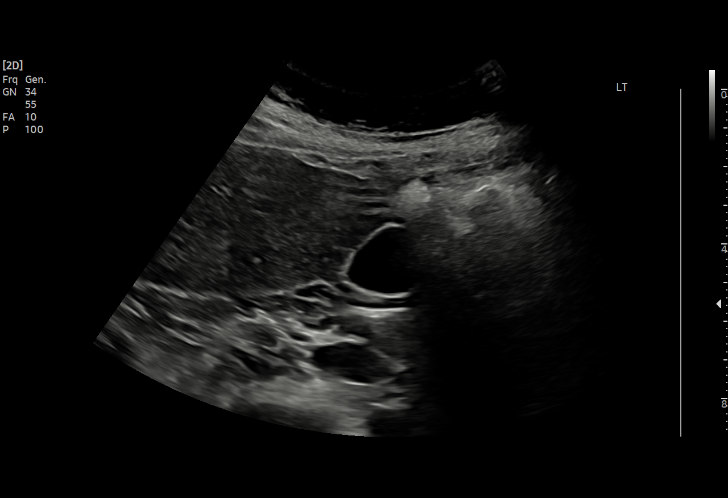
[im 26/123]
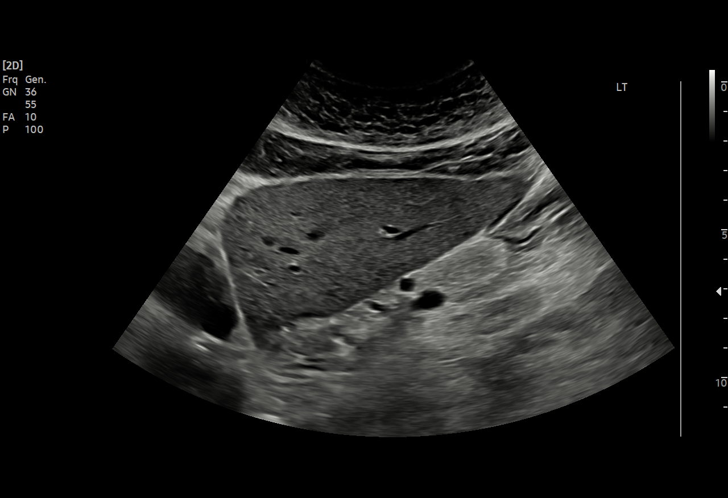
[im 36/123]
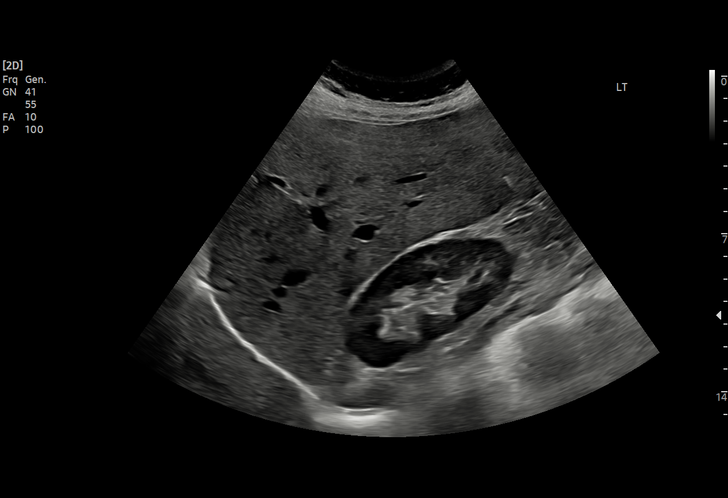
[im 46/123]
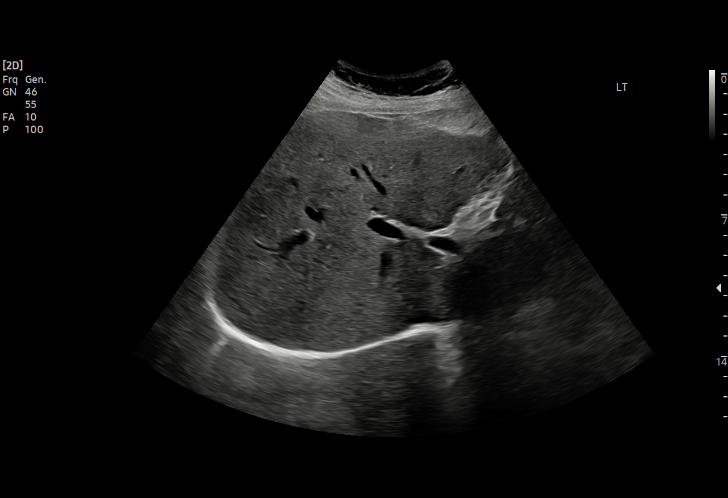
[im 51/123]
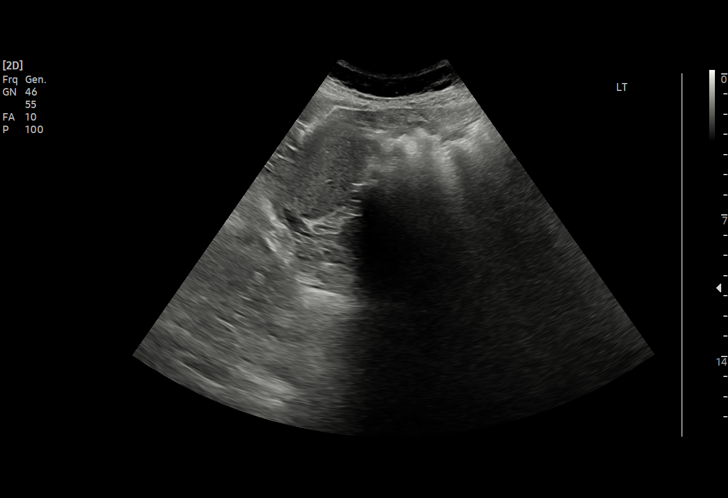
[im 62/123]
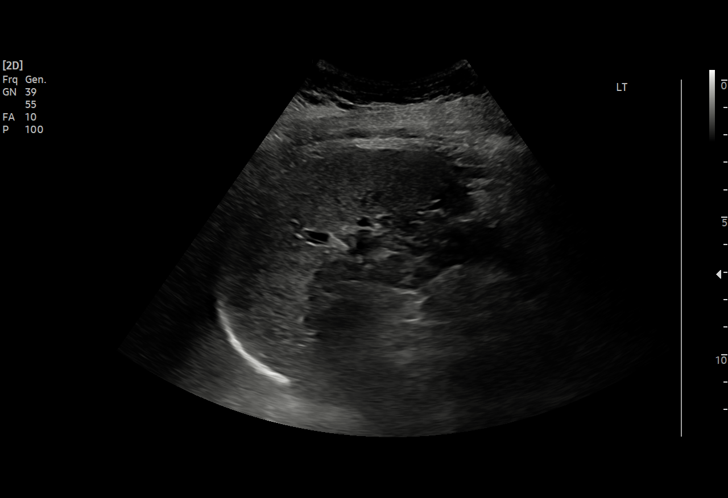
[im 72/123]
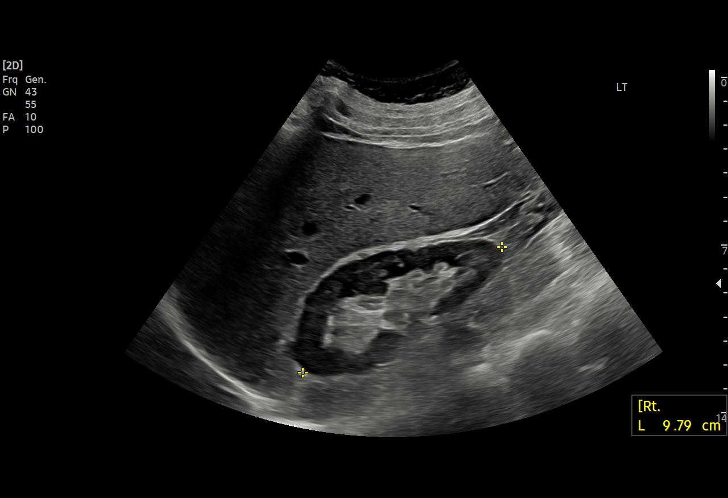
[im 77/123]
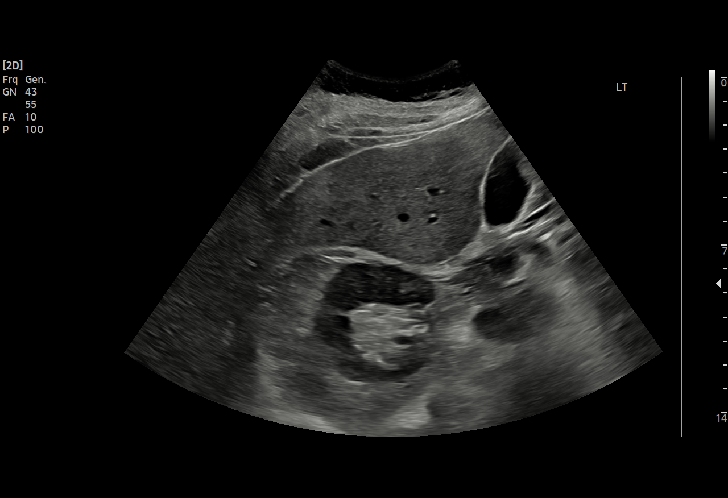
[im 87/123]
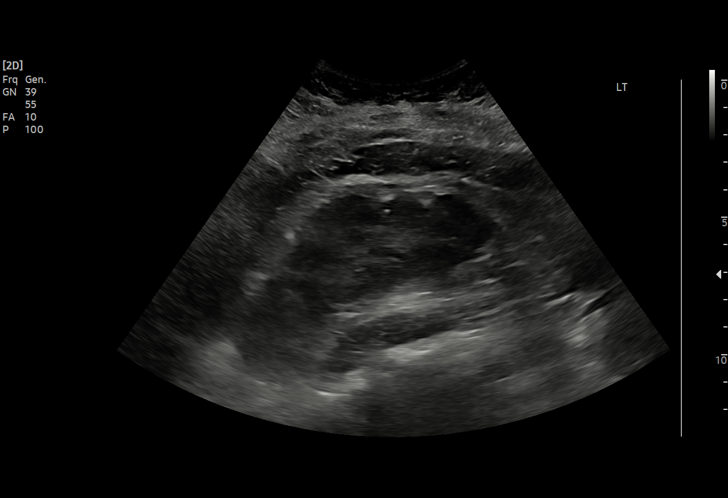
[im 97/123]
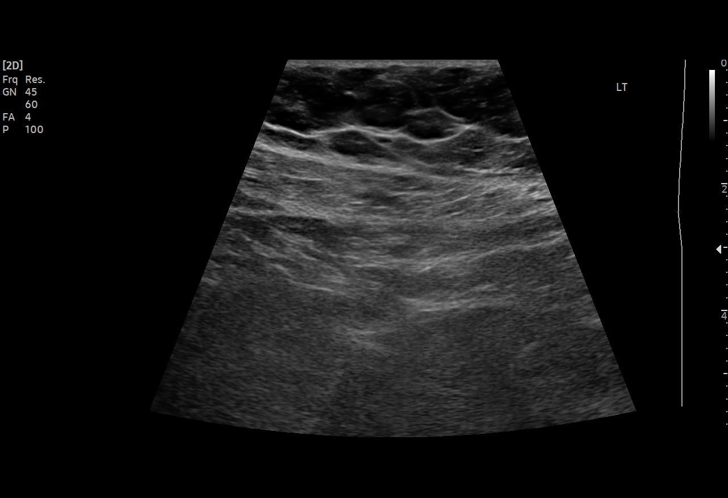
[im 102/123]
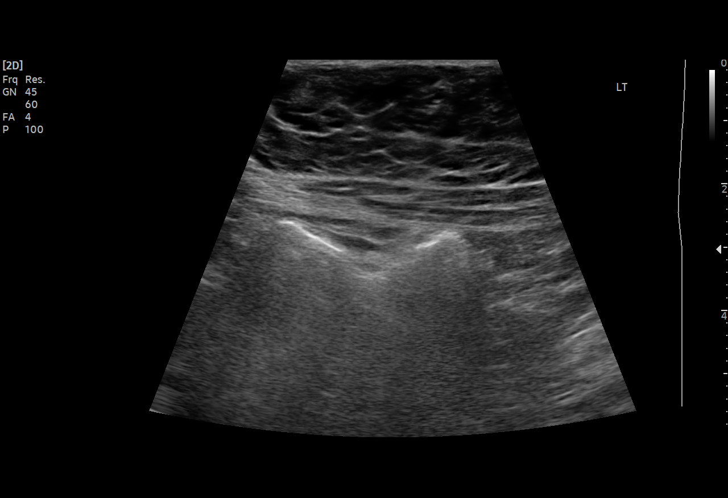
[im 112/123]
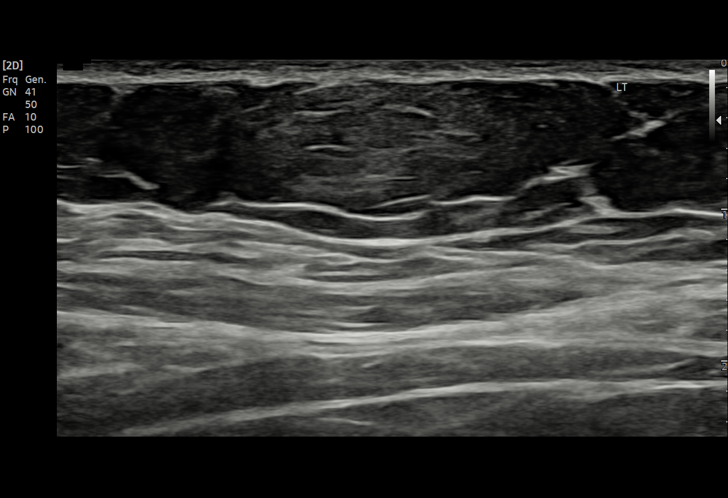
[im 123/123]
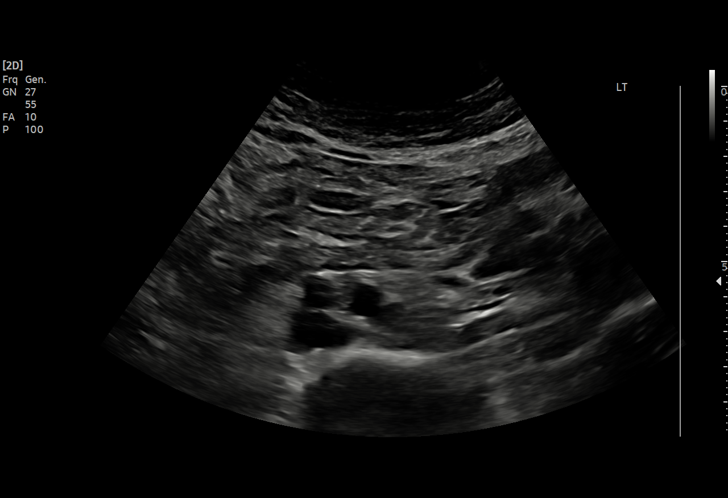

[15 of 25 positions shown; findings below may reference images not displayed]

FINDINGS: Gallbladder: No gallstones or wall thickening visualized. No
sonographic Murphy sign noted by sonographer.

Common bile duct: Diameter: 3.3 mm

Liver: Increased echogenicity in the liver with no focal mass.
Portal vein is patent on color Doppler imaging with normal direction
of blood flow towards the liver.

IVC: No abnormality visualized.

Pancreas: Visualized portion unremarkable.

Spleen: Size and appearance within normal limits.

Right Kidney: Length: 9.8 cm. Echogenicity within normal limits. No
mass or hydronephrosis visualized.

Left Kidney: Length: 9.6 cm. Echogenicity within normal limits. No
mass or hydronephrosis visualized.

Abdominal aorta: No aneurysm visualized.

Other findings: In the right upper quadrant, just below the ribs,
there is a hyperechoic mass measuring 18 x 8 x 17 mm in the
subcutaneous tissues. This is the palpable region felt by the
patient.
IMPRESSION: 1. The hyperechoic mass measuring 18 x 8 x 17 mm in the subcutaneous
region of the patient's symptoms is nonspecific but likely a lipoma.
2. Increased echogenicity in the liver with no focal mass is
nonspecific but often due to hepatic steatosis.

## 2021-12-03 DIAGNOSIS — F419 Anxiety disorder, unspecified: Secondary | ICD-10-CM | POA: Diagnosis not present

## 2022-03-07 ENCOUNTER — Emergency Department (HOSPITAL_COMMUNITY): Payer: 59

## 2022-03-07 ENCOUNTER — Encounter (HOSPITAL_COMMUNITY): Payer: Self-pay

## 2022-03-07 ENCOUNTER — Emergency Department (HOSPITAL_COMMUNITY)
Admission: EM | Admit: 2022-03-07 | Discharge: 2022-03-07 | Disposition: A | Discharge status: Home / Self Care | Payer: 59 | Attending: Emergency Medicine | Admitting: Emergency Medicine

## 2022-03-07 DIAGNOSIS — N23 Unspecified renal colic: Secondary | ICD-10-CM | POA: Diagnosis not present

## 2022-03-07 DIAGNOSIS — N132 Hydronephrosis with renal and ureteral calculous obstruction: Secondary | ICD-10-CM | POA: Diagnosis not present

## 2022-03-07 DIAGNOSIS — D259 Leiomyoma of uterus, unspecified: Secondary | ICD-10-CM | POA: Diagnosis not present

## 2022-03-07 DIAGNOSIS — R109 Unspecified abdominal pain: Secondary | ICD-10-CM | POA: Diagnosis present

## 2022-03-07 DIAGNOSIS — N210 Calculus in bladder: Secondary | ICD-10-CM | POA: Diagnosis not present

## 2022-03-07 LAB — BASIC METABOLIC PANEL
Anion gap: 8 (ref 5–15)
BUN: 24 mg/dL — ABNORMAL HIGH (ref 6–20)
CO2: 26 mmol/L (ref 22–32)
Calcium: 9.2 mg/dL (ref 8.9–10.3)
Chloride: 106 mmol/L (ref 98–111)
Creatinine, Ser: 1.17 mg/dL — ABNORMAL HIGH (ref 0.44–1.00)
GFR, Estimated: 55 mL/min — ABNORMAL LOW (ref >60)
Glucose, Bld: 139 mg/dL — ABNORMAL HIGH (ref 70–99)
Potassium: 3.8 mmol/L (ref 3.5–5.1)
Sodium: 140 mmol/L (ref 135–145)

## 2022-03-07 LAB — CBC WITH DIFFERENTIAL/PLATELET
Abs Immature Granulocytes: 0.03 10*3/uL (ref 0.00–0.07)
Basophils Absolute: 0 10*3/uL (ref 0.0–0.1)
Basophils Relative: 0 %
Eosinophils Absolute: 0.1 10*3/uL (ref 0.0–0.5)
Eosinophils Relative: 1 %
HCT: 41.5 % (ref 36.0–46.0)
Hemoglobin: 14.3 g/dL (ref 12.0–15.0)
Immature Granulocytes: 0 %
Lymphocytes Relative: 13 %
Lymphs Abs: 1.2 10*3/uL (ref 0.7–4.0)
MCH: 31.9 pg (ref 26.0–34.0)
MCHC: 34.5 g/dL (ref 30.0–36.0)
MCV: 92.6 fL (ref 80.0–100.0)
Monocytes Absolute: 0.4 10*3/uL (ref 0.1–1.0)
Monocytes Relative: 4 %
Neutro Abs: 7.3 10*3/uL (ref 1.7–7.7)
Neutrophils Relative %: 82 %
Platelets: 196 10*3/uL (ref 150–400)
RBC: 4.48 MIL/uL (ref 3.87–5.11)
RDW: 12.3 % (ref 11.5–15.5)
WBC: 8.9 10*3/uL (ref 4.0–10.5)
nRBC: 0 % (ref 0.0–0.2)

## 2022-03-07 LAB — URINALYSIS, ROUTINE W REFLEX MICROSCOPIC
Bacteria, UA: NONE SEEN
Bilirubin Urine: NEGATIVE
Glucose, UA: NEGATIVE mg/dL
Hgb urine dipstick: NEGATIVE
Ketones, ur: NEGATIVE mg/dL
Nitrite: NEGATIVE
Protein, ur: NEGATIVE mg/dL
Specific Gravity, Urine: 1.021 (ref 1.005–1.030)
pH: 5 (ref 5.0–8.0)

## 2022-03-07 MED ORDER — ONDANSETRON 8 MG PO TBDP
8.0000 mg | ORAL_TABLET | Freq: Three times a day (TID) | ORAL | 0 refills | Status: DC | PRN
  Filled 2022-03-07: qty 20, 7d supply, fill #0

## 2022-03-07 MED ORDER — SODIUM CHLORIDE 0.9 % IV SOLN: INTRAVENOUS | Status: DC

## 2022-03-07 MED ORDER — OXYCODONE-ACETAMINOPHEN 5-325 MG PO TABS: 1.0000 | ORAL_TABLET | Freq: Four times a day (QID) | ORAL | 0 refills | Status: DC | PRN

## 2022-03-07 MED ORDER — OXYCODONE-ACETAMINOPHEN 5-325 MG PO TABS
1.0000 | ORAL_TABLET | Freq: Four times a day (QID) | ORAL | 0 refills | Status: DC | PRN
  Filled 2022-03-07: qty 15, 2d supply, fill #0

## 2022-03-07 MED ORDER — KETOROLAC TROMETHAMINE 30 MG/ML IJ SOLN
15.0000 mg | Freq: Once | INTRAMUSCULAR | Status: AC
  Administered 2022-03-07: 15 mg via INTRAVENOUS
  Filled 2022-03-07: qty 1

## 2022-03-07 MED ORDER — ONDANSETRON HCL 4 MG/2ML IJ SOLN
4.0000 mg | Freq: Once | INTRAMUSCULAR | Status: AC
  Administered 2022-03-07: 4 mg via INTRAVENOUS
  Filled 2022-03-07: qty 2

## 2022-03-07 NOTE — ED Provider Notes (Signed)
?[facility] DEPT ?Provider Note ? ? ?CSN: [id number] ?Arrival date & time: 03/07/22  1452 ? ?  ? ?History ? ?Chief Complaint  ?Patient presents with  ? Flank Pain  ? Vomiting  ? ? ?ROLLANDE THURSBY is a 56 y.o. female. ? ?55 year old female presents with 24 hours of colicky right-sided flank pain.  Had some associated nausea and vomiting.  No prior history of kidney stones.  Denies any urinary symptoms.  No change in bowel or bladder function.  Does note some slight dysuria.  No treatment use prior to arrival. ? ? ?  ? ?Home Medications ?Prior to Admission medications   ?Medication Sig Start Date End Date Taking? Authorizing Provider  ?diclofenac sodium (VOLTAREN) 1 % GEL  07/31/15   [provider]  ?Ketoprofen POWD APPLY TO AFFECTED AREA THREE TIMES DAILY. ?Patient not taking: Reported on 08/27/2019 08/25/16   Stefanie Libel, MD  ?meloxicam (MOBIC) 15 MG tablet Take 1 tablet (15 mg total) by mouth daily as needed for pain. ?Patient not taking: Reported on 08/27/2019 05/21/15   Gerda Diss, DO  ?nitroGLYCERIN (NITRODUR - DOSED IN MG/24 HR) 0.2 mg/hr patch Place 1/4 of patch over affected region. Remove and replace once daily.  Slightly alter skin placement daily ?Patient not taking: Reported on 08/27/2019 04/09/15   Gerda Diss, DO  ?   ? ?Allergies    ?Patient has no known allergies.   ? ?Review of Systems   ?Review of Systems  ?All other systems reviewed and are negative. ? ?Physical Exam ?Updated Vital Signs ?BP (!) 156/103 (BP Location: Left Arm)   Pulse 65   Temp 98 ?F (36.7 ?C) (Oral)   Resp 18   SpO2 96%  ?Physical Exam ?Vitals and nursing note reviewed.  ?Constitutional:   ?   General: She is not in acute distress. ?   Appearance: Normal appearance. She is well-developed. She is not toxic-appearing.  ?HENT:  ?   Head: Normocephalic and atraumatic.  ?Eyes:  ?   General: Lids are normal.  ?   Conjunctiva/sclera: Conjunctivae normal.  ?   Pupils: Pupils are equal,  round, and reactive to light.  ?Neck:  ?   Thyroid: No thyroid mass.  ?   Trachea: No tracheal deviation.  ?Cardiovascular:  ?   Rate and Rhythm: Normal rate and regular rhythm.  ?   Heart sounds: Normal heart sounds. No murmur heard. ?  No gallop.  ?Pulmonary:  ?   Effort: Pulmonary effort is normal. No respiratory distress.  ?   Breath sounds: Normal breath sounds. No stridor. No decreased breath sounds, wheezing, rhonchi or rales.  ?Abdominal:  ?   General: There is no distension.  ?   Palpations: Abdomen is soft.  ?   Tenderness: There is no abdominal tenderness. There is left CVA tenderness. There is no rebound.  ?Musculoskeletal:     ?   General: No tenderness. Normal range of motion.  ?   Cervical back: Normal range of motion and neck supple.  ?Skin: ?   General: Skin is warm and dry.  ?   Findings: No abrasion or rash.  ?Neurological:  ?   Mental Status: She is alert and oriented to person, place, and time. Mental status is at baseline.  ?   GCS: GCS eye subscore is 4. GCS verbal subscore is 5. GCS motor subscore is 6.  ?   Cranial Nerves: No cranial nerve deficit.  ?   Sensory:  No sensory deficit.  ?   Motor: Motor function is intact.  ?Psychiatric:     ?   Attention and Perception: Attention normal.     ?   Speech: Speech normal.     ?   Behavior: Behavior normal.  ? ? ?ED Results / Procedures / Treatments   ?Labs ?(all labs ordered are listed, but only abnormal results are displayed) ?Labs Reviewed  ?URINALYSIS, ROUTINE W REFLEX MICROSCOPIC  ?CBC WITH DIFFERENTIAL/PLATELET  ?BASIC METABOLIC PANEL  ? ? ?EKG ?None ? ?Radiology ?No results found. ? ?Procedures ?Procedures  ? ? ?Medications Ordered in ED ?Medications  ?0.9 %  sodium chloride infusion (has no administration in time range)  ?ketorolac (TORADOL) 30 MG/ML injection 15 mg (has no administration in time range)  ?ondansetron (ZOFRAN) injection 4 mg (has no administration in time range)  ? ? ?ED Course/ Medical Decision Making/ A&P ?  ?                         ?Medical Decision Making ?Amount and/or Complexity of Data Reviewed ?Labs: ordered. ?Radiology: ordered. ? ?Risk ?Prescription drug management. ? ? ?Patient given Toradol for pain here and does feel better.  Urinalysis shows increased red blood cells.  Concern for possible renal colic.  Renal CT performed and per my interpretation shows 2 mm renal calculus.  Patient's pain is controlled at this time.  No concern for ovarian pathology.  We discharged home with referral to urology on-call.  Patient and husband are agreeable to this ? ? ? ? ? ? ? ?Final Clinical Impression(s) / ED Diagnoses ?Final diagnoses:  ?None  ? ? ?Rx / DC Orders ?ED Discharge Orders   ? ? None  ? ?  ? ? ?  ?Lacretia Leigh, MD ?03/07/22 1618 ? ?

## 2022-03-07 NOTE — ED Triage Notes (Signed)
Pt presents with c/o right side flank pain and vomiting that started today. Pt also reports some pain in her urethra. Pt reports the pain is present in that area constantly, not just when she urinates.  ?

## 2022-03-08 ENCOUNTER — Other Ambulatory Visit (HOSPITAL_COMMUNITY): Payer: Self-pay

## 2022-03-19 DIAGNOSIS — M25511 Pain in right shoulder: Secondary | ICD-10-CM | POA: Insufficient documentation

## 2022-03-30 DIAGNOSIS — N202 Calculus of kidney with calculus of ureter: Secondary | ICD-10-CM | POA: Diagnosis not present

## 2022-04-14 ENCOUNTER — Telehealth: Payer: Self-pay

## 2022-04-14 NOTE — Telephone Encounter (Signed)
Will accept

## 2022-04-14 NOTE — Telephone Encounter (Signed)
Pt is calling stating that Lita Mains spoke with Dr. Quay Burow about becoming a new patient.   I advised pt upon approval I would give her a call back to get her schedule.  Please advise

## 2022-04-15 NOTE — Telephone Encounter (Signed)
Thank you. Scheduled for 04/29/22

## 2022-04-28 DIAGNOSIS — E559 Vitamin D deficiency, unspecified: Secondary | ICD-10-CM | POA: Insufficient documentation

## 2022-04-28 DIAGNOSIS — K760 Fatty (change of) liver, not elsewhere classified: Secondary | ICD-10-CM | POA: Insufficient documentation

## 2022-04-28 NOTE — Progress Notes (Signed)
Subjective:    Patient ID: Jennifer Casey, female    DOB: 29-Dec-1965, 56 y.o.   MRN: [id number]     HPI Jennifer Casey is here to establish with a new PCP and for follow up of her chronic medical problems.   Nephrolithiasis - recent Ed visit 03/07/22 with flank pain - found to have stone in bladder near uterovesicular junction with mild hydroureteronephrosis.  B/l nephrolithiasis.  Lipomas - has a few.  Has one that bothers her on right side.    Horn on forehead.  Larger.    Hi chol - would prefer lifestyle --- has lost 10 lbs   Works with trainer twice a week.  30 min weights /2 per weeks.  Cycyles, elliptical.   3-5 times a day.    H/o migraines   Medications and allergies reviewed with patient and updated if appropriate.  Current Outpatient Medications on File Prior to Visit  Medication Sig Dispense Refill   clobetasol ointment (TEMOVATE) 4.65 % 1 application to affected area     Ibuprofen (MOTRIN PO) Motrin     loratadine (CLARITIN) 10 MG tablet 1 tablet     Multiple Vitamins-Minerals (CENTRUM WOMEN) TABS See admin instructions.     oxyCODONE-acetaminophen (PERCOCET/ROXICET) 5-325 MG tablet Take 1-2 tablets by mouth every 6 (six) hours as needed for severe pain. 15 tablet 0   No current facility-administered medications on file prior to visit.     Review of Systems  Constitutional:  Negative for chills and fever.  Respiratory:  Negative for cough, shortness of breath and wheezing.   Cardiovascular:  Negative for chest pain, palpitations and leg swelling.  Gastrointestinal:  Negative for abdominal pain, constipation and diarrhea.       [place]  Genitourinary:  Negative for dysuria.  Musculoskeletal:  Positive for arthralgias (knees, b/l thumb pain). Negative for back pain.  Neurological:  Positive for headaches (occ migraines). Negative for dizziness and light-headedness.  Psychiatric/Behavioral:  Negative for dysphoric mood. The patient is not nervous/anxious.         Objective:   Vitals:   04/29/22 1112  BP: 124/80  Pulse: 98  Temp: 98.1 F (36.7 C)  SpO2: 98%   BP Readings from Last 3 Encounters:  04/29/22 124/80  03/07/22 136/66  08/27/19 124/86   Wt Readings from Last 3 Encounters:  04/29/22 183 lb (83 kg)  08/27/19 170 lb (77.1 kg)  01/25/19 180 lb (81.6 kg)   Body mass index is 36.96 kg/m.    Physical Exam Constitutional:      General: She is not in acute distress.    Appearance: Normal appearance.  HENT:     Head: Normocephalic and atraumatic.  Eyes:     Conjunctiva/sclera: Conjunctivae normal.  Cardiovascular:     Rate and Rhythm: Normal rate and regular rhythm.     Heart sounds: Normal heart sounds. No murmur heard. Pulmonary:     Effort: Pulmonary effort is normal. No respiratory distress.     Breath sounds: Normal breath sounds. No wheezing.  Musculoskeletal:     Cervical back: Neck supple.     Right lower leg: No edema.     Left lower leg: No edema.  Lymphadenopathy:     Cervical: No cervical adenopathy.  Skin:    General: Skin is warm and dry.     Findings: No rash.  Neurological:     Mental Status: She is alert. Mental status is at baseline.  Psychiatric:  Mood and Affect: Mood normal.        Behavior: Behavior normal.        Lab Results  Component Value Date   WBC 8.9 03/07/2022   HGB 14.3 03/07/2022   HCT 41.5 03/07/2022   PLT 196 03/07/2022   GLUCOSE 139 (H) 03/07/2022   NA 140 03/07/2022   K 3.8 03/07/2022   CL 106 03/07/2022   CREATININE 1.17 (H) 03/07/2022   BUN 24 (H) 03/07/2022   CO2 26 03/07/2022     Assessment & Plan:    See Problem List for Assessment and Plan of chronic medical problems.

## 2022-04-29 ENCOUNTER — Ambulatory Visit: Payer: 59 | Admitting: Internal Medicine

## 2022-04-29 ENCOUNTER — Encounter: Payer: Self-pay | Admitting: Internal Medicine

## 2022-04-29 DIAGNOSIS — E782 Mixed hyperlipidemia: Secondary | ICD-10-CM

## 2022-04-29 DIAGNOSIS — J302 Other seasonal allergic rhinitis: Secondary | ICD-10-CM | POA: Diagnosis not present

## 2022-04-29 DIAGNOSIS — M25511 Pain in right shoulder: Secondary | ICD-10-CM

## 2022-04-29 DIAGNOSIS — Z8669 Personal history of other diseases of the nervous system and sense organs: Secondary | ICD-10-CM | POA: Diagnosis not present

## 2022-04-29 DIAGNOSIS — D179 Benign lipomatous neoplasm, unspecified: Secondary | ICD-10-CM | POA: Insufficient documentation

## 2022-04-29 DIAGNOSIS — E7800 Pure hypercholesterolemia, unspecified: Secondary | ICD-10-CM | POA: Insufficient documentation

## 2022-04-29 DIAGNOSIS — N200 Calculus of kidney: Secondary | ICD-10-CM | POA: Diagnosis not present

## 2022-04-29 DIAGNOSIS — E785 Hyperlipidemia, unspecified: Secondary | ICD-10-CM | POA: Insufficient documentation

## 2022-04-29 NOTE — Assessment & Plan Note (Signed)
Has seen someone for this-doing physical therapy-improving

## 2022-04-29 NOTE — Patient Instructions (Addendum)
   It was nice to meet you.   Medications changes include :   red yeast rice for cholesterol.  Tumeric, tart cherry supplements, glucosamine supplements for arthritis. You can try thumb splints, copper gloves and voltaren gel otc for arthritis pain.         Return for Physical Exam.

## 2022-04-29 NOTE — Assessment & Plan Note (Signed)
Takes otc med prn

## 2022-04-29 NOTE — Assessment & Plan Note (Signed)
Chronic ?  Genetic in nature LDL around 170 She is exercising regularly and eats fairly healthy Would like to avoid statin Discussed that she could consider trying red yeast rice

## 2022-04-29 NOTE — Assessment & Plan Note (Signed)
Several throughout body 1 bothers her slightly-1 under her rib cage, but the rest are asymptomatic

## 2022-06-09 ENCOUNTER — Encounter: Payer: 59 | Admitting: Internal Medicine

## 2022-06-09 DIAGNOSIS — F4320 Adjustment disorder, unspecified: Secondary | ICD-10-CM | POA: Diagnosis not present

## 2022-07-07 ENCOUNTER — Encounter: Payer: Self-pay | Admitting: Internal Medicine

## 2022-07-07 DIAGNOSIS — R739 Hyperglycemia, unspecified: Secondary | ICD-10-CM | POA: Insufficient documentation

## 2022-07-07 NOTE — Progress Notes (Signed)
Subjective:    Patient ID: Jennifer Casey, female    DOB: Aug 10, 1966, 56 y.o.   MRN: [id number]      HPI Jennifer Casey is here for a Physical exam.   She is concerned about her cholesterol.  She has not started any supplements for that.  She is continuing to exercise regularly.   Medications and allergies reviewed with patient and updated if appropriate.  Current Outpatient Medications on File Prior to Visit  Medication Sig Dispense Refill   clobetasol ointment (TEMOVATE) 2.42 % 1 application to affected area     Ibuprofen (MOTRIN PO) Motrin     loratadine (CLARITIN) 10 MG tablet 1 tablet     Multiple Vitamins-Minerals (CENTRUM WOMEN) TABS See admin instructions.     oxyCODONE-acetaminophen (PERCOCET/ROXICET) 5-325 MG tablet Take 1-2 tablets by mouth every 6 (six) hours as needed for severe pain. 15 tablet 0   triamcinolone cream (KENALOG) 0.1 % APPLY A THIN LAYER TO THE AFFECTED AREA(S) TWICE DAILY     No current facility-administered medications on file prior to visit.    Review of Systems  Constitutional:  Negative for fever.  Eyes:  Negative for visual disturbance.  Respiratory:  Negative for cough, shortness of breath and wheezing.   Cardiovascular:  Positive for leg swelling (with prolonged sitting). Negative for chest pain and palpitations.  Gastrointestinal:  Negative for abdominal pain, blood in stool, constipation, diarrhea and nausea.       No gerd  Genitourinary:  Negative for dysuria.  Musculoskeletal:  Positive for arthralgias (knees). Negative for back pain.  Skin:  Negative for rash.  Neurological:  Negative for light-headedness and headaches.  Psychiatric/Behavioral:  Negative for dysphoric mood. The patient is not nervous/anxious.        Objective:   Vitals:   07/12/22 1346  BP: 118/76  Pulse: 80  Temp: 98.2 F (36.8 C)  SpO2: 96%   Filed Weights   07/12/22 1346  Weight: 182 lb 12.8 oz (82.9 kg)   Body mass index is 36.92 kg/m.  BP  Readings from Last 3 Encounters:  07/12/22 118/76  04/29/22 124/80  03/07/22 136/66    Wt Readings from Last 3 Encounters:  07/12/22 182 lb 12.8 oz (82.9 kg)  04/29/22 183 lb (83 kg)  08/27/19 170 lb (77.1 kg)       Physical Exam Constitutional: She appears well-developed and well-nourished. No distress.  HENT:  Head: Normocephalic and atraumatic.  Right Ear: External ear normal. Normal ear canal and TM Left Ear: External ear normal.  Normal ear canal and TM Mouth/Throat: Oropharynx is clear and moist.  Eyes: Conjunctivae normal.  Neck: Neck supple. No tracheal deviation present. No thyromegaly present.  No carotid bruit  Cardiovascular: Normal rate, regular rhythm and normal heart sounds.   No murmur heard.  No edema. Pulmonary/Chest: Effort normal and breath sounds normal. No respiratory distress. She has no wheezes. She has no rales.  Breast: deferred   Abdominal: Soft. She exhibits no distension. There is no tenderness.  Lymphadenopathy: She has no cervical adenopathy.  Skin: Skin is warm and dry. She is not diaphoretic.  Psychiatric: She has a normal mood and affect. Her behavior is normal.     Lab Results  Component Value Date   WBC 5.3 07/12/2022   HGB 14.4 07/12/2022   HCT 41.6 07/12/2022   PLT 188.0 07/12/2022   GLUCOSE 88 07/12/2022   CHOL 245 (H) 07/12/2022   TRIG 114.0 07/12/2022   HDL 57.90 07/12/2022  LDLCALC 164 (H) 07/12/2022   ALT 12 07/12/2022   AST 13 07/12/2022   NA 143 07/12/2022   K 3.9 07/12/2022   CL 103 07/12/2022   CREATININE 0.95 07/12/2022   BUN 20 07/12/2022   CO2 28 07/12/2022   TSH 1.24 07/12/2022   HGBA1C 5.6 07/12/2022    The 10-year ASCVD risk score (Arnett DK, et al., 2019) is: 2%   Values used to calculate the score:     Age: 89 years     Sex: Female     Is Non-Hispanic African American: No     Diabetic: No     Tobacco smoker: No     Systolic Blood Pressure: 517 mmHg     Is BP treated: No     HDL Cholesterol: 57.9  mg/dL     Total Cholesterol: 245 mg/dL      Assessment & Plan:   Physical exam: Screening blood work  ordered Exercise  rides bike, elliptical, trainer -- 5-6 times a week Weight  - obese Substance abuse  none   Reviewed recommended immunizations.   Health Maintenance  Topic Date Due   PAP SMEAR-Modifier  Never done   COLONOSCOPY (Pts 45-60yr Insurance coverage will need to be confirmed)  Never done   Zoster Vaccines- Shingrix (1 of 2) Never done   MAMMOGRAM  04/10/2017   COVID-19 Vaccine (5 - Moderna series) 07/28/2022 (Originally 07/12/2021)   INFLUENZA VACCINE  02/20/2023 (Originally 06/22/2022)   TETANUS/TDAP  06/02/2031   HPV VACCINES  Aged Out   Hepatitis C Screening  Discontinued   HIV Screening  Discontinued          See Problem List for Assessment and Plan of chronic medical problems.

## 2022-07-07 NOTE — Patient Instructions (Addendum)
Blood work was ordered.     Medications changes include :   none    Return in about 1 year (around 07/13/2023) for Physical Exam.    Health Maintenance, Female Adopting a healthy lifestyle and getting preventive care are important in promoting health and wellness. Ask your health care provider about: The right schedule for you to have regular tests and exams. Things you can do on your own to prevent diseases and keep yourself healthy. What should I know about diet, weight, and exercise? Eat a healthy diet  Eat a diet that includes plenty of vegetables, fruits, low-fat dairy products, and lean protein. Do not eat a lot of foods that are high in solid fats, added sugars, or sodium. Maintain a healthy weight Body mass index (BMI) is used to identify weight problems. It estimates body fat based on height and weight. Your health care provider can help determine your BMI and help you achieve or maintain a healthy weight. Get regular exercise Get regular exercise. This is one of the most important things you can do for your health. Most adults should: Exercise for at least 150 minutes each week. The exercise should increase your heart rate and make you sweat (moderate-intensity exercise). Do strengthening exercises at least twice a week. This is in addition to the moderate-intensity exercise. Spend less time sitting. Even light physical activity can be beneficial. Watch cholesterol and blood lipids Have your blood tested for lipids and cholesterol at 56 years of age, then have this test every 5 years. Have your cholesterol levels checked more often if: Your lipid or cholesterol levels are high. You are older than 56 years of age. You are at high risk for heart disease. What should I know about cancer screening? Depending on your health history and family history, you may need to have cancer screening at various ages. This may include screening for: Breast cancer. Cervical  cancer. Colorectal cancer. Skin cancer. Lung cancer. What should I know about heart disease, diabetes, and high blood pressure? Blood pressure and heart disease High blood pressure causes heart disease and increases the risk of stroke. This is more likely to develop in people who have high blood pressure readings or are overweight. Have your blood pressure checked: Every 3-5 years if you are 61-75 years of age. Every year if you are 74 years old or older. Diabetes Have regular diabetes screenings. This checks your fasting blood sugar level. Have the screening done: Once every three years after age 47 if you are at a normal weight and have a low risk for diabetes. More often and at a younger age if you are overweight or have a high risk for diabetes. What should I know about preventing infection? Hepatitis B If you have a higher risk for hepatitis B, you should be screened for this virus. Talk with your health care provider to find out if you are at risk for hepatitis B infection. Hepatitis C Testing is recommended for: Everyone born from [birth year] through 1965. Anyone with known risk factors for hepatitis C. Sexually transmitted infections (STIs) Get screened for STIs, including gonorrhea and chlamydia, if: You are sexually active and are younger than 56 years of age. You are older than 56 years of age and your health care provider tells you that you are at risk for this type of infection. Your sexual activity has changed since you were last screened, and you are at increased risk for chlamydia or gonorrhea. Ask your health  care provider if you are at risk. Ask your health care provider about whether you are at high risk for HIV. Your health care provider may recommend a prescription medicine to help prevent HIV infection. If you choose to take medicine to prevent HIV, you should first get tested for HIV. You should then be tested every 3 months for as long as you are taking the  medicine. Pregnancy If you are about to stop having your period (premenopausal) and you may become pregnant, seek counseling before you get pregnant. Take 400 to 800 micrograms (mcg) of folic acid every day if you become pregnant. Ask for birth control (contraception) if you want to prevent pregnancy. Osteoporosis and menopause Osteoporosis is a disease in which the bones lose minerals and strength with aging. This can result in bone fractures. If you are 21 years old or older, or if you are at risk for osteoporosis and fractures, ask your health care provider if you should: Be screened for bone loss. Take a calcium or vitamin D supplement to lower your risk of fractures. Be given hormone replacement therapy (HRT) to treat symptoms of menopause. Follow these instructions at home: Alcohol use Do not drink alcohol if: Your health care provider tells you not to drink. You are pregnant, may be pregnant, or are planning to become pregnant. If you drink alcohol: Limit how much you have to: 0-1 drink a day. Know how much alcohol is in your drink. In the [place] one drink equals one 12 oz bottle of beer (355 mL), one 5 oz glass of wine (148 mL), or one 1 oz glass of hard liquor (44 mL). Lifestyle Do not use any products that contain nicotine or tobacco. These products include cigarettes, chewing tobacco, and vaping devices, such as e-cigarettes. If you need help quitting, ask your health care provider. Do not use street drugs. Do not share needles. Ask your health care provider for help if you need support or information about quitting drugs. General instructions Schedule regular health, dental, and eye exams. Stay current with your vaccines. Tell your health care provider if: You often feel depressed. You have ever been abused or do not feel safe at home. Summary Adopting a healthy lifestyle and getting preventive care are important in promoting health and wellness. Follow your health care  provider's instructions about healthy diet, exercising, and getting tested or screened for diseases. Follow your health care provider's instructions on monitoring your cholesterol and blood pressure. This information is not intended to replace advice given to you by your health care provider. Make sure you discuss any questions you have with your health care provider. Document Revised: 03/30/2021 Document Reviewed: 03/30/2021 Elsevier Patient Education  [place].

## 2022-07-12 ENCOUNTER — Telehealth: Payer: Self-pay | Admitting: Internal Medicine

## 2022-07-12 ENCOUNTER — Ambulatory Visit (INDEPENDENT_AMBULATORY_CARE_PROVIDER_SITE_OTHER): Payer: 59 | Admitting: Internal Medicine

## 2022-07-12 VITALS — BP 118/76 | HR 80 | Temp 98.2°F | Ht 59.0 in | Wt 182.8 lb

## 2022-07-12 DIAGNOSIS — K760 Fatty (change of) liver, not elsewhere classified: Secondary | ICD-10-CM | POA: Diagnosis not present

## 2022-07-12 DIAGNOSIS — Z0000 Encounter for general adult medical examination without abnormal findings: Secondary | ICD-10-CM | POA: Diagnosis not present

## 2022-07-12 DIAGNOSIS — E559 Vitamin D deficiency, unspecified: Secondary | ICD-10-CM

## 2022-07-12 DIAGNOSIS — R739 Hyperglycemia, unspecified: Secondary | ICD-10-CM

## 2022-07-12 DIAGNOSIS — E782 Mixed hyperlipidemia: Secondary | ICD-10-CM | POA: Diagnosis not present

## 2022-07-12 LAB — CBC WITH DIFFERENTIAL/PLATELET
Basophils Absolute: 0 10*3/uL (ref 0.0–0.1)
Basophils Relative: 0.4 % (ref 0.0–3.0)
Eosinophils Absolute: 0.2 10*3/uL (ref 0.0–0.7)
Eosinophils Relative: 3.9 % (ref 0.0–5.0)
HCT: 41.6 % (ref 36.0–46.0)
Hemoglobin: 14.4 g/dL (ref 12.0–15.0)
Lymphocytes Relative: 35.2 % (ref 12.0–46.0)
Lymphs Abs: 1.8 10*3/uL (ref 0.7–4.0)
MCHC: 34.5 g/dL (ref 30.0–36.0)
MCV: 93.3 fl (ref 78.0–100.0)
Monocytes Absolute: 0.4 10*3/uL (ref 0.1–1.0)
Monocytes Relative: 6.7 % (ref 3.0–12.0)
Neutro Abs: 2.8 10*3/uL (ref 1.4–7.7)
Neutrophils Relative %: 53.8 % (ref 43.0–77.0)
Platelets: 188 10*3/uL (ref 150.0–400.0)
RBC: 4.46 Mil/uL (ref 3.87–5.11)
RDW: 12.6 % (ref 11.5–15.5)
WBC: 5.3 10*3/uL (ref 4.0–10.5)

## 2022-07-12 LAB — COMPREHENSIVE METABOLIC PANEL
ALT: 12 U/L (ref 0–35)
AST: 13 U/L (ref 0–37)
Albumin: 4.5 g/dL (ref 3.5–5.2)
Alkaline Phosphatase: 58 U/L (ref 39–117)
BUN: 20 mg/dL (ref 6–23)
CO2: 28 mEq/L (ref 19–32)
Calcium: 9.7 mg/dL (ref 8.4–10.5)
Chloride: 103 mEq/L (ref 96–112)
Creatinine, Ser: 0.95 mg/dL (ref 0.40–1.20)
GFR: 67.29 mL/min (ref >60.00)
Glucose, Bld: 88 mg/dL (ref 70–99)
Potassium: 3.9 mEq/L (ref 3.5–5.1)
Sodium: 143 mEq/L (ref 135–145)
Total Bilirubin: 0.6 mg/dL (ref 0.2–1.2)
Total Protein: 7.4 g/dL (ref 6.0–8.3)

## 2022-07-12 LAB — LIPID PANEL
Cholesterol: 245 mg/dL — ABNORMAL HIGH (ref 0–200)
HDL: 57.9 mg/dL (ref >39.00)
LDL Cholesterol: 164 mg/dL — ABNORMAL HIGH (ref 0–99)
NonHDL: 186.8
Total CHOL/HDL Ratio: 4
Triglycerides: 114 mg/dL (ref 0.0–149.0)
VLDL: 22.8 mg/dL (ref 0.0–40.0)

## 2022-07-12 LAB — TSH: TSH: 1.24 u[IU]/mL (ref 0.35–5.50)

## 2022-07-12 LAB — VITAMIN D 25 HYDROXY (VIT D DEFICIENCY, FRACTURES): VITD: 33.98 ng/mL (ref 30.00–100.00)

## 2022-07-12 LAB — HEMOGLOBIN A1C: Hgb A1c MFr Bld: 5.6 % (ref 4.6–6.5)

## 2022-07-12 NOTE — Telephone Encounter (Signed)
Labs done this morning.

## 2022-07-12 NOTE — Assessment & Plan Note (Signed)
Chronic Check lipid panel, CMP If lipids are still high would consider trying red yeast rice to see if that helps Continue healthy lifestyle-regular exercise and healthy diet

## 2022-07-12 NOTE — Assessment & Plan Note (Signed)
Chronic Taking multivitamin intermittently Check vitamin D level

## 2022-07-12 NOTE — Assessment & Plan Note (Signed)
Chronic Check a1c Low sugar / carb diet Stressed regular exercise  

## 2022-07-12 NOTE — Telephone Encounter (Signed)
Pt has appt today at 140pm and she said she would like to know if Dr. Quay Burow would be ok with her coming to have her labs done this morning because she does not want to have to wait until after her appt to eat.   Please advise.

## 2022-07-12 NOTE — Assessment & Plan Note (Signed)
Chronic CMP today She is eating and diet and working on weight loss Continue regular exercise

## 2022-07-12 NOTE — Telephone Encounter (Signed)
Error.  What labs were you wanting to do because she just had labs done on 8/16 or does she not need any current labs again?

## 2022-08-20 ENCOUNTER — Ambulatory Visit: Payer: 59 | Admitting: Family Medicine

## 2022-08-20 ENCOUNTER — Ambulatory Visit
Admission: RE | Admit: 2022-08-20 | Discharge: 2022-08-20 | Disposition: A | Discharge status: Home / Self Care | Payer: 59 | Source: Ambulatory Visit | Attending: Family Medicine | Admitting: Family Medicine

## 2022-08-20 ENCOUNTER — Ambulatory Visit: Payer: Self-pay

## 2022-08-20 ENCOUNTER — Encounter: Payer: Self-pay | Admitting: Family Medicine

## 2022-08-20 VITALS — BP 144/98 | Ht 59.0 in | Wt 185.0 lb

## 2022-08-20 DIAGNOSIS — M1712 Unilateral primary osteoarthritis, left knee: Secondary | ICD-10-CM | POA: Diagnosis not present

## 2022-08-20 DIAGNOSIS — M25562 Pain in left knee: Secondary | ICD-10-CM

## 2022-08-20 DIAGNOSIS — M170 Bilateral primary osteoarthritis of knee: Secondary | ICD-10-CM | POA: Diagnosis not present

## 2022-08-20 NOTE — Progress Notes (Signed)
[place] Attending Note: I have seen and examined this patient. I have discussed this patient with the resident and reviewed the assessment and plan as documented above. I agree with the resident's findings and plan. Acute knee pain that I think is most likely related to patellofemoral joint arthritis.  We will treat as such and have her follow-up.

## 2022-08-20 NOTE — Patient Instructions (Addendum)
It was great to see you!  We are checking x-rays of your knee. You can go to [facility] to have these done. No appointment necessary. They have locations at [place] as well as [employer] ([place])  ICE with a large bag of frozen peas once daily for 15 minutes.  Ride your stationary bike at a low resistance. It's ok to ride outside but avoid hills or steep terrain.  NO squats or stairs.  Do the quad strengthening exercises 4-5 times per week.  Take care, Dr Rock Nephew & Dr Nori Riis

## 2022-08-20 NOTE — Progress Notes (Signed)
    PCP: Binnie Rail, MD  Subjective:   HPI: Patient is a 56 y.o. female here for left knee pain.  8 days ago patient woke up with left knee pain. She noticed the pain specifically when bearing weight and walking. Did not have any preceding injury, trauma, or change in activity level. She did notice a feeling of instability the week prior, but didn't have pain at that time. Pain is vague and generalized throughout anterior knee. Causes her to limp.  She rode her bike in [place] ~20 miles and it felt better afterwards. The following day she was only able to ride 5-6 miles and then had to stop due to knee pain.  Patient rides her bike approximately twice per week, uses the elliptical ~4 times per week, and does a lot of squats and stairs with her trainer.  No past medical history on file.  Current Outpatient Medications on File Prior to Visit  Medication Sig Dispense Refill   clobetasol ointment (TEMOVATE) 5.62 % 1 application to affected area     Ibuprofen (MOTRIN PO) Motrin     loratadine (CLARITIN) 10 MG tablet 1 tablet     Multiple Vitamins-Minerals (CENTRUM WOMEN) TABS See admin instructions.     oxyCODONE-acetaminophen (PERCOCET/ROXICET) 5-325 MG tablet Take 1-2 tablets by mouth every 6 (six) hours as needed for severe pain. 15 tablet 0   triamcinolone cream (KENALOG) 0.1 % APPLY A THIN LAYER TO THE AFFECTED AREA(S) TWICE DAILY     No current facility-administered medications on file prior to visit.    No past surgical history on file.  No Known Allergies  BP (!) 144/98   Ht '4\' 11"'$  (1.499 m)   Wt 185 lb (83.9 kg)   BMI 37.37 kg/m       Objective:  Physical Exam:  Gen: NAD, comfortable in exam room Knee, left: Inspection was negative for erythema, ecchymosis, and effusion. Palpation yielded no asymmetric warmth; No joint line tenderness; No patellar tenderness; No significant knee crepitus. Mild tenderness of the pes anserine bursa. No obvious Baker's cyst  development. ROM normal in flexion (135 degrees) and extension (0 degrees). Strength 5/5 with knee flexion and extension. Neurovascularly intact distally.   Provocative Testing:  - Patella:   - Patellar grind/compression: positive - Cruciate Ligaments:   - Anterior Drawer/Lachman test: NEG - Posterior Drawer: NEG  - Collateral Ligaments:   - Varus/Valgus (MCL/LCL) Stress test at 0, 15d: NEG   - Apley's Compression/Distraction test: NEG  - Meniscus:   - McMurray's: NEG    Assessment & Plan:  1. Left knee pain: atraumatic, vague anterior knee pain x 1 week. Positive patellar grind but otherwise unremarkable exam. No appreciable abnormality on limited bedside ultrasound today aside from some osteophyte development. Suspect a component of patellofemoral syndrome as well as degenerative disease. Will obtain bilateral standing x-rays and sunrise view on left. Trial conservative treatment with strengthening exercises, ice, and activity modification.   Alcus Dad, MD PGY-3, [place]

## 2022-08-23 ENCOUNTER — Ambulatory Visit (INDEPENDENT_AMBULATORY_CARE_PROVIDER_SITE_OTHER): Payer: 59 | Admitting: Sports Medicine

## 2022-08-23 ENCOUNTER — Ambulatory Visit (INDEPENDENT_AMBULATORY_CARE_PROVIDER_SITE_OTHER): Payer: 59

## 2022-08-23 ENCOUNTER — Encounter: Payer: Self-pay | Admitting: Family Medicine

## 2022-08-23 ENCOUNTER — Encounter: Payer: Self-pay | Admitting: Sports Medicine

## 2022-08-23 VITALS — Ht 59.0 in | Wt 183.0 lb

## 2022-08-23 DIAGNOSIS — M25562 Pain in left knee: Secondary | ICD-10-CM

## 2022-08-23 DIAGNOSIS — M2352 Chronic instability of knee, left knee: Secondary | ICD-10-CM

## 2022-08-23 NOTE — Progress Notes (Signed)
1 week ago; woke up and could not bear any weight on that side. Took a long road a trip; then a plane ride to [place], sore but could walk some.  Was able to do a 20 mile bike ride 1 day, then 6 mile day after (to which it got painful)  X-rays were taken on 08/20/22   Yesterday morning felt and heard a pop and could not bear any weight on it; she stated she collapsed and had to get help to get up.  No swelling; taking motrin/tylenol for pain (does help)

## 2022-08-23 NOTE — Progress Notes (Signed)
Jennifer Casey - 56 y.o. female MRN [id number]  Date of birth: 12/04/1965  Office Visit Note: Visit Date: 08/23/2022 PCP: Binnie Rail, MD Referred by: Binnie Rail, MD  Subjective: Chief Complaint  Patient presents with   Left Knee - Pain   HPI: Jennifer Casey is a very pleasant 56 y.o. female who presents today for acute aggravation of left knee pain.   About 2 weeks ago, the patient woke up with left knee pain.  Noticed the pain specifically with weightbearing and walking.  Denied any previous injury.  She did see the sports medicine center on 08/20/2022 and they thought she had patellofemoral OA at that time.  Just here recently on Sunday she was walking home from church where she felt and heard a pop that caused the leg to give out and fall.  She could not bear any weight on the knee since that time.  She has been having to use the assistance of a cane to walk since that time.  She normally is very active, she actually biked about a 20 mile bike ride in [place], then a 6 mile bike ride but then had to stop because the pain was worse.  Normally she rides about twice a week, uses the elliptical 3-4 times a week as well. Medications used: using Motrin and tylenol which helps to some extent. No pain at rest, but pain with walking/rising.  Pertinent ROS were reviewed with the patient and found to be negative unless otherwise specified above in HPI.   Assessment & Plan: Visit Diagnoses:  1. Acute pain of left knee   2. Recurrent left knee instability    Plan: Jennifer Casey has had a reoccurence of sharp knee pain over the lateral side of the knee, and just here Sunday she felt a pop that had the left knee give out.  Prior to that she had sharp knee pain without known injury.  I am curious if she had a meniscal tear that then was recently exacerbated by her fall/pop.  She is normally a very active individual and is even needing to use a cane to help ambulate.  True ligamental testing  was difficult to appreciate today secondary to patient pain and guarding.  At this point given the swelling, inability to walk and recurrence of her injury, I think it is best to obtain MRI scan to evaluate for the lateral meniscus, PLC and other surrounding internal structures.  She will follow-up a few days following the MRI to discuss next steps.  She may continue ice, rest and over-the-counter anti-inflammatories until that time.  She has been taking ibuprofen 200 mg, I did recommend increasing this to 600 mg every 6-8 hours as needed.  Follow-up: Return for Follow-up about 2-3 days after MRI knee.   Meds & Orders: No orders of the defined types were placed in this encounter.   Orders Placed This Encounter  Procedures   XR Knee Complete 4 Views Left   MR Knee Left w/o contrast     Procedures: No procedures performed      Clinical History: No specialty comments available.  She reports that she has never smoked. She has never used smokeless tobacco.  Recent Labs    07/12/22 1442  HGBA1C 5.6    Objective:   Vital Signs: Ht '4\' 11"'$  (1.499 m)   Wt 183 lb (83 kg)   BMI 36.96 kg/m   Physical Exam  Gen: Well-appearing, in no acute distress; non-toxic CV:  Regular Rate. Well-perfused. Warm.  Resp: Breathing unlabored on room air; no wheezing. Psych: Fluid speech in conversation; appropriate affect; normal thought process Neuro: Sensation intact throughout. No gross coordination deficits.   Ortho Exam - Left knee: There is quite exquisite TTP over the lateral joint line as well as the posterior lateral corner of the lateral tibial plateau and on fibular head.  Small joint effusion noted.  No redness or ecchymosis.  Range of motion from 0-125 degrees, pain with endrange flexion.  No true varus or valgus instability.  There is significant pain over the lateral compartment with McMurray's testing.  Thessaly's testing declined.  There is pain with associated guarding with dial testing both  at 30 and 90 degrees.  True ligamental testing unable to be appreciated secondary to patient pain.  Neurovascular intact distally.  Negative patellar grind.  Imaging: XR Knee Complete 4 Views Left  Result Date: 08/23/2022 4 views of the left knee including AP, Rosenberg, lateral and sunrise views were ordered and reviewed by myself.  X-rays show some mild patellofemoral arthritis.  There is only mild OA without significant joint narrowing of either compartment.  There is a spur off the posterior aspect of the proximal tibia on lateral film.  No acute fracture noted.   XR Knee Complete 4 Views Left 4 views of the left knee including AP, Rosenberg, lateral and sunrise  views were ordered and reviewed by myself.  X-rays show some mild  patellofemoral arthritis.  There is only mild OA without significant joint  narrowing of either compartment.  There is a spur off the posterior aspect  of the proximal tibia on lateral film.  No acute fracture noted. DG Knee AP/LAT W/Sunrise Left CLINICAL DATA:  Left knee pain.  No injury.  EXAM: LEFT KNEE 3 VIEWS; BILATERAL KNEES STANDING - 1 VIEW  COMPARISON:  None Available.  FINDINGS: No evidence of fracture, dislocation, or joint effusion. Mild to moderate narrow left patellofemoral joint space with osteophyte formation are noted. Soft tissues are unremarkable.  IMPRESSION: Mild to moderate degenerative joint changes of left patellofemoral joint space.  Electronically Signed   By: Abelardo Diesel M.D.   On: 08/23/2022 09:41 DG Knee Bilateral Standing AP CLINICAL DATA:  Left knee pain.  No injury.  EXAM: LEFT KNEE 3 VIEWS; BILATERAL KNEES STANDING - 1 VIEW  COMPARISON:  None Available.  FINDINGS: No evidence of fracture, dislocation, or joint effusion. Mild to moderate narrow left patellofemoral joint space with osteophyte formation are noted. Soft tissues are unremarkable.  IMPRESSION: Mild to moderate degenerative joint changes of left  patellofemoral joint space.  Electronically Signed   By: Abelardo Diesel M.D.   On: 08/23/2022 09:41    Past Medical/Family/Surgical/Social History: Medications & Allergies reviewed per EMR, new medications updated. Patient Active Problem List   Diagnosis Date Noted   Hyperglycemia 07/07/2022   Hyperlipidemia 04/29/2022   Seasonal allergies 04/29/2022   Multiple lipomas 04/29/2022   History of migraine headaches 04/29/2022   Nephrolithiasis 04/29/2022   Fatty liver 04/28/2022   Vitamin D deficiency 04/28/2022   Pain in joint of right shoulder 03/19/2022   Family history of neoplasm of ovary 75/08/2584   Lichen sclerosus et atrophicus 10/25/2016   Abnormality of gait 10/08/2015   Knee pain, bilateral 05/08/2009   No past medical history on file. Family History  Problem Relation Age of Onset   Osteoarthritis Mother    Diabetes Father    Heart disease Father    Kidney disease Father  Hyperlipidemia Father    No past surgical history on file. Social History   Occupational History   Not on file  Tobacco Use   Smoking status: Never   Smokeless tobacco: Never  Substance and Sexual Activity   Alcohol use: Not on file   Drug use: Not on file   Sexual activity: Not on file

## 2022-08-24 DIAGNOSIS — F4320 Adjustment disorder, unspecified: Secondary | ICD-10-CM | POA: Diagnosis not present

## 2022-08-25 ENCOUNTER — Encounter: Payer: Self-pay | Admitting: Sports Medicine

## 2022-08-26 ENCOUNTER — Ambulatory Visit (HOSPITAL_COMMUNITY)
Admission: RE | Admit: 2022-08-26 | Discharge: 2022-08-26 | Disposition: A | Discharge status: Home / Self Care | Payer: 59 | Source: Ambulatory Visit | Attending: Sports Medicine | Admitting: Sports Medicine

## 2022-08-26 DIAGNOSIS — M25562 Pain in left knee: Secondary | ICD-10-CM | POA: Diagnosis not present

## 2022-08-26 DIAGNOSIS — G8929 Other chronic pain: Secondary | ICD-10-CM | POA: Diagnosis not present

## 2022-08-26 DIAGNOSIS — M25462 Effusion, left knee: Secondary | ICD-10-CM | POA: Diagnosis not present

## 2022-08-26 DIAGNOSIS — S83242A Other tear of medial meniscus, current injury, left knee, initial encounter: Secondary | ICD-10-CM | POA: Diagnosis not present

## 2022-08-27 ENCOUNTER — Telehealth: Payer: Self-pay | Admitting: Sports Medicine

## 2022-08-27 ENCOUNTER — Ambulatory Visit: Payer: 59 | Admitting: Family Medicine

## 2022-08-27 NOTE — Telephone Encounter (Signed)
Ready for pick up. Informed patient via Mychart

## 2022-08-27 NOTE — Telephone Encounter (Signed)
Pt called and stated she left a handicap placard form. Asking if she can pick up today. Pt phone number is [phone]

## 2022-08-30 ENCOUNTER — Encounter: Payer: Self-pay | Admitting: Sports Medicine

## 2022-08-30 ENCOUNTER — Ambulatory Visit: Payer: 59 | Admitting: Sports Medicine

## 2022-08-30 DIAGNOSIS — M25562 Pain in left knee: Secondary | ICD-10-CM | POA: Diagnosis not present

## 2022-08-30 DIAGNOSIS — M25362 Other instability, left knee: Secondary | ICD-10-CM | POA: Diagnosis not present

## 2022-08-30 DIAGNOSIS — S83242A Other tear of medial meniscus, current injury, left knee, initial encounter: Secondary | ICD-10-CM

## 2022-08-30 DIAGNOSIS — S83249A Other tear of medial meniscus, current injury, unspecified knee, initial encounter: Secondary | ICD-10-CM

## 2022-08-30 NOTE — Progress Notes (Signed)
MRI review for knee. On crutches today to help ambulate Still having some pain posteriorly; and unsteady feeling

## 2022-08-30 NOTE — Progress Notes (Signed)
Jennifer Casey - 56 y.o. female MRN [id number]  Date of birth: 09-05-1966  Office Visit Note: Visit Date: 08/30/2022 PCP: Binnie Rail, MD Referred by: Binnie Rail, MD  Subjective: No chief complaint on file.  HPI: Jennifer Casey is a pleasant 56 y.o. female who presents today for follow-up of left knee pain and MRI review.  Since our initial visit last week, pain has continued in the knee.  She has actually been needing to walk with crutches and/or a cane for support.  She continues with posterior medial knee pain, sometimes in the lateral aspect as well.  More worrisome, she is having episodes of instability and giving out on the knee.  Has used Motrin and Tylenol which helped mildly, although has been unable to get back into activity.  MRI of the left knee from 08/26/2022 shows a radial tear of the posterior horn of the medial meniscus near the meniscal root.  She is not a diabetic.  She is not a smoker.  Last vitamin D on 07/12/2022 was 34.  Pertinent ROS were reviewed with the patient and found to be negative unless otherwise specified above in HPI.   Assessment & Plan: Visit Diagnoses:  1. Acute radial tear of medial meniscus   2. Knee instability, left    Plan: Had a discussion with Markan regarding her knee pain and did review her MRI which show a radial tear of the posterior horn of the medial meniscus.  Discussed with her this was an acute pain given her injury.  Given the location (radial tear) of the meniscus tear with her instability about the knee, I would like her to see my partner, Dr. Ninfa Linden, to discuss meniscectomy +/- repair.  We did discuss nonoperative management such as cortisone injection, physical therapy and time could still be an option but I would like Dr. Trevor Mace opinion nonsurgical management.  Her MRI did show high-grade partial-thickness cartilage loss of the medial femoral condyle, although on x-rays I do see preserved joint space, so I do  think arthroscopy is warranted.  If nonoperative management is decided, I am happy to see her back and follow her through this course.  Follow-up with Dr. Ninfa Linden.  Follow-up: Return for Follow-up with blacman to discuss possible knee arthroscopy.   Meds & Orders: No orders of the defined types were placed in this encounter.  No orders of the defined types were placed in this encounter.   Procedures: No procedures performed      Clinical History: No specialty comments available.  She reports that she has never smoked. She has never used smokeless tobacco.  Recent Labs    07/12/22 1442  HGBA1C 5.6    Objective:    Physical Exam  Gen: Well-appearing, in no acute distress; non-toxic CV: Regular Rate. Well-perfused. Warm.  Resp: Breathing unlabored on room air; no wheezing. Psych: Fluid speech in conversation; appropriate affect; normal thought process Neuro: Sensation intact throughout. No gross coordination deficits.   Ortho Exam - Left knee: Positive TTP over the posterior lateral and medial joint line.  Small joint effusion noted.  There is guarding with ligamental testing.  Range of motion from 0-130 degrees.  Neurovascularly intact distally.  Imaging:  MR Knee Left w/o contrast CLINICAL DATA:  Chronic knee pain.  EXAM: MRI OF THE LEFT KNEE WITHOUT CONTRAST  TECHNIQUE: Multiplanar, multisequence MR imaging of the knee was performed. No intravenous contrast was administered.  COMPARISON:  None Available.  FINDINGS: MENISCI  Medial:  Radial tear of the posterior horn of the medial meniscus towards the meniscal root.  Lateral: Intact.  LIGAMENTS  Cruciates: ACL and PCL are intact.  Collaterals: Medial collateral ligament is intact. Lateral collateral ligament complex is intact.  CARTILAGE  Patellofemoral: Full-thickness cartilage loss of the medial patellofemoral compartment.  Medial: High-grade partial-thickness cartilage loss of the medial femoral  condyle. Partial-thickness cartilage loss the medial tibial plateau.  Lateral:  No chondral defect.  JOINT: Small joint effusion. Normal Hoffa's fat-pad. No plical thickening.  POPLITEAL FOSSA: Popliteus tendon is intact. No Baker's cyst.  EXTENSOR MECHANISM: Intact quadriceps tendon. Intact patellar tendon. Intact lateral patellar retinaculum. Intact medial patellar retinaculum. Intact MPFL.  BONES: No aggressive osseous lesion. No fracture or dislocation.  Other: No fluid collection or hematoma. Muscles are normal.  IMPRESSION: 1. Radial tear of the posterior horn of the medial meniscus towards the meniscal root. 2. Full-thickness cartilage loss of the medial patellofemoral compartment. 3. High-grade partial-thickness cartilage loss of the medial femoral condyle. Partial-thickness cartilage loss the medial tibial plateau.  Electronically Signed   By: Kathreen Devoid M.D.   On: 08/30/2022 08:28    Past Medical/Family/Surgical/Social History: Medications & Allergies reviewed per EMR, new medications updated. Patient Active Problem List   Diagnosis Date Noted   Hyperglycemia 07/07/2022   Hyperlipidemia 04/29/2022   Seasonal allergies 04/29/2022   Multiple lipomas 04/29/2022   History of migraine headaches 04/29/2022   Nephrolithiasis 04/29/2022   Fatty liver 04/28/2022   Vitamin D deficiency 04/28/2022   Pain in joint of right shoulder 03/19/2022   Family history of neoplasm of ovary 31/54/0086   Lichen sclerosus et atrophicus 10/25/2016   Abnormality of gait 10/08/2015   Knee pain, bilateral 05/08/2009   History reviewed. No pertinent past medical history. Family History  Problem Relation Age of Onset   Osteoarthritis Mother    Diabetes Father    Heart disease Father    Kidney disease Father    Hyperlipidemia Father    History reviewed. No pertinent surgical history. Social History   Occupational History   Not on file  Tobacco Use   Smoking status: Never    Smokeless tobacco: Never  Substance and Sexual Activity   Alcohol use: Not on file   Drug use: Not on file   Sexual activity: Not on file

## 2022-09-01 ENCOUNTER — Ambulatory Visit: Payer: 59 | Admitting: Orthopaedic Surgery

## 2022-09-01 DIAGNOSIS — S83242D Other tear of medial meniscus, current injury, left knee, subsequent encounter: Secondary | ICD-10-CM | POA: Diagnosis not present

## 2022-09-01 MED ORDER — LIDOCAINE HCL 1 % IJ SOLN
3.0000 mL | INTRAMUSCULAR | Status: AC | PRN
  Administered 2022-09-01: 3 mL

## 2022-09-01 MED ORDER — METHYLPREDNISOLONE ACETATE 40 MG/ML IJ SUSP
40.0000 mg | INTRAMUSCULAR | Status: AC | PRN
  Administered 2022-09-01: 40 mg via INTRA_ARTICULAR

## 2022-09-01 NOTE — Progress Notes (Signed)
Office Visit Note   Patient: Jennifer Casey           Date of Birth: 1966-05-08           MRN: [id number] Visit Date: 09/01/2022              Requested by: Binnie Rail, MD [place],   [id number] PCP: Binnie Rail, MD   Assessment & Plan: Visit Diagnoses:  1. Acute medial meniscal tear, left, subsequent encounter     Plan: I do believe there is an acute aspect of her left knee medial meniscal tear and likely some chronic changes as well.  I would like to try a steroid injection in her left knee today to see if this calms down the pain that she is experiencing especially at night.  I will continue to let her weight-bear as tolerated and would like to reevaluate her in 2 weeks.  She agrees with this treatment plan so we did place a steroid injection in her left knee today which she tolerated well.  She will hold off getting on her exercise bike until next week.  She can try the elliptical starting next week as well.  In 2 weeks we will get an idea of how the steroid is done for her and then to consider an arthroscopic intervention in the near future if she continues to have symptoms that worsen with her left knee.  Follow-Up Instructions: Return in about 2 weeks (around 09/15/2022).   Orders:  Orders Placed This Encounter  Procedures   Large Joint Inj   No orders of the defined types were placed in this encounter.     Procedures: Large Joint Inj: L knee on 09/01/2022 1:51 PM Indications: diagnostic evaluation and pain Details: 22 G 1.5 in needle, superolateral approach  Arthrogram: No  Medications: 3 mL lidocaine 1 %; 40 mg methylPREDNISolone acetate 40 MG/ML Outcome: tolerated well, no immediate complications Procedure, treatment alternatives, risks and benefits explained, specific risks discussed. Consent was given by the patient. Immediately prior to procedure a time out was called to verify the correct patient, procedure, equipment, support staff  and site/side marked as required. Patient was prepped and draped in the usual sterile fashion.       Clinical Data: No additional findings.   Subjective: Chief Complaint  Patient presents with   Left Knee - Pain  The patient comes in today for evaluation treatment of an acute injury to her left knee.  She actually had some issues with that knee chronically but nothing terrible until she woke up 1 day with difficulty weightbearing and some locking and catching with the knee.  Most of her pain is at night and right now she is denying any mechanical symptoms.  She has been offloading the knee using a cane and a knee brace.  Her recent MRI of her knee showed a meniscal root tear of the medial meniscus of the left knee.  It only showed some mild arthritic changes.  She is sent here for further ration treatment of meniscal tear.  She is a professor at [employer].  She has not had a steroid injection in that knee and has never had surgery on the knee before.  HPI  Review of Systems There is no listed fever, chills, nausea, vomiting  Objective: Vital Signs: There were no vitals taken for this visit.  Physical Exam She is ambulating with a slight limp.  She is alert and oriented  x3 and in no acute distress Ortho Exam Examination of her left knee shows no effusion.  She has full flexion extension with some posterior medial pain.  Her Lachman's and McMurray's exams are actually negative. Specialty Comments:  No specialty comments available.  Imaging: No results found. The MRI independently reviewed of her left knee does show signal changes in the medial meniscus at the posterior horn and root consistent with a tear.  PMFS History: Patient Active Problem List   Diagnosis Date Noted   Hyperglycemia 07/07/2022   Hyperlipidemia 04/29/2022   Seasonal allergies 04/29/2022   Multiple lipomas 04/29/2022   History of migraine headaches 04/29/2022   Nephrolithiasis 04/29/2022   Fatty  liver 04/28/2022   Vitamin D deficiency 04/28/2022   Pain in joint of right shoulder 03/19/2022   Family history of neoplasm of ovary 86/16/8372   Lichen sclerosus et atrophicus 10/25/2016   Abnormality of gait 10/08/2015   Knee pain, bilateral 05/08/2009   No past medical history on file.  Family History  Problem Relation Age of Onset   Osteoarthritis Mother    Diabetes Father    Heart disease Father    Kidney disease Father    Hyperlipidemia Father     No past surgical history on file. Social History   Occupational History   Not on file  Tobacco Use   Smoking status: Never   Smokeless tobacco: Never  Substance and Sexual Activity   Alcohol use: Not on file   Drug use: Not on file   Sexual activity: Not on file

## 2022-09-16 ENCOUNTER — Ambulatory Visit: Payer: 59 | Admitting: Orthopaedic Surgery

## 2022-09-16 ENCOUNTER — Encounter: Payer: Self-pay | Admitting: Orthopaedic Surgery

## 2022-09-16 DIAGNOSIS — S83242D Other tear of medial meniscus, current injury, left knee, subsequent encounter: Secondary | ICD-10-CM

## 2022-09-16 DIAGNOSIS — S83249A Other tear of medial meniscus, current injury, unspecified knee, initial encounter: Secondary | ICD-10-CM | POA: Diagnosis not present

## 2022-09-16 NOTE — Progress Notes (Signed)
The patient comes in today for continued follow-up as a relates to her left knee acute medial meniscal tear of the posterior horn root area of the meniscus.  She still has symptoms of instability she states with that knee but her pain is significantly better and she got significant relief with a steroid injection.  She has been back to riding a bike.  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  Examination of her left knee today shows no effusion.  Flexion extension is full of the knee but the extreme of flexion does cause some posterior medial pain.  She also does have irritation of the medial meniscus with McMurray stressing and rotation of the tibia on the femur to the medial compartment.  I would like to start outpatient physical therapy for her here at [facility] as soon as possible to work on proprioception and strength in the quad muscles.  I would then see her back in late November to make a determination of whether or not an arthroscopic intervention at that point would be warranted.  This will depend on her mechanical symptoms.  She understands that as well and agrees with this treatment plan.

## 2022-09-17 ENCOUNTER — Other Ambulatory Visit: Payer: Self-pay

## 2022-09-17 DIAGNOSIS — M2352 Chronic instability of knee, left knee: Secondary | ICD-10-CM

## 2022-09-17 DIAGNOSIS — M25562 Pain in left knee: Secondary | ICD-10-CM

## 2022-09-24 DIAGNOSIS — F4320 Adjustment disorder, unspecified: Secondary | ICD-10-CM | POA: Diagnosis not present

## 2022-09-24 NOTE — Therapy (Signed)
OUTPATIENT PHYSICAL THERAPY LOWER EXTREMITY EVALUATION   Patient Name: Jennifer Casey MRN: [id number] DOB:[birth year], 56 y.o., female Today's Date: 09/29/2022   PT End of Session - 09/29/22 1745     Visit Number 1    Number of Visits 12    PT Start Time 5784    PT Stop Time 1520    PT Time Calculation (min) 42 min    Activity Tolerance Patient tolerated treatment well;No increased pain    Behavior During Therapy [facility] for tasks assessed/performed             History reviewed. No pertinent past medical history. History reviewed. No pertinent surgical history. Patient Active Problem List   Diagnosis Date Noted   Hyperglycemia 07/07/2022   Hyperlipidemia 04/29/2022   Seasonal allergies 04/29/2022   Multiple lipomas 04/29/2022   History of migraine headaches 04/29/2022   Nephrolithiasis 04/29/2022   Fatty liver 04/28/2022   Vitamin D deficiency 04/28/2022   Pain in joint of right shoulder 03/19/2022   Family history of neoplasm of ovary 69/62/9528   Lichen sclerosus et atrophicus 10/25/2016   Abnormality of gait 10/08/2015   Knee pain, bilateral 05/08/2009    PCP: Binnie Rail, MD  REFERRING PROVIDER: Mcarthur Rossetti, MD  REFERRING DIAG:  Diagnosis  M23.52 (ICD-10-CM) - Recurrent left knee instability  M25.562 (ICD-10-CM) - Acute pain of left knee    THERAPY DIAG:  Difficulty in walking, not elsewhere classified  Muscle weakness (generalized)  Left knee pain, unspecified chronicity  Rationale for Evaluation and Treatment: Rehabilitation  ONSET DATE: ~ 3-4 weeks ago was the "final straw"  SUBJECTIVE:   SUBJECTIVE STATEMENT: Jennifer Casey notes she has had difficulty putting weight on her left knee when she gets up to use the bathroom in the middle of the night for years.  She woke up 4-5 weeks ago and couldn't put any weight on her left knee.  She fell a few weeks ago and this brings her here today.  PERTINENT HISTORY: Hyperglycemia, HLD, history of  migraines, R shoulder and Bil knee pain PAIN:  Are you having pain? Yes: NPRS scale: This week 0-5/10 Pain location: L medial knee Pain description: Has been sharp, ache Aggravating factors: Stairs, when falling Relieving factors: Cortisone, motrin  PRECAUTIONS: None  WEIGHT BEARING RESTRICTIONS: No  FALLS:  Has patient fallen in last 6 months? Yes. 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 Radial tear of the posterior horn of the medial meniscus towards the meniscal root. 2. Full-thickness cartilage loss of the medial patellofemoral compartment. 3. High-grade partial-thickness cartilage loss of the medial femoral condyle. Partial-thickness cartilage loss the medial tibial plateau.  X-rays show some mild  patellofemoral arthritis.  There is only mild OA without significant joint  narrowing of either compartment.  There is a spur off the posterior aspect  of the proximal tibia on lateral film.  No acute fracture noted.  PATIENT SURVEYS:  FOTO 49 (Goal 65 in 12 visits)  COGNITION: Overall cognitive status: Within functional limits for tasks assessed     SENSATION: No complaints of peripheral pain or paresthesias.   LOWER EXTREMITY ROM:  Active ROM Right 09/29/2022 Left 09/29/2022  Hip flexion  Hip extension    Hip abduction    Hip adduction    Hip internal rotation    Hip external rotation    Knee flexion 138 140  Knee extension 0 0-2 (hyper-extension)  Ankle dorsiflexion    Ankle plantarflexion    Ankle inversion    Ankle eversion     (Blank rows = not tested)  LOWER EXTREMITY STRENGTH:  STRENGTH Right 09/29/2022 Left 09/29/2022  Hip flexion    Hip  extension    Hip abduction    Hip adduction    Hip internal rotation    Hip external rotation    Knee flexion    Knee extension 66.8 31.9   Ankle dorsiflexion    Ankle plantarflexion    Ankle inversion    Ankle eversion     (Blank rows = not tested)  GAIT: Distance walked: In the clinic Assistive device utilized:  None today or since a cortisone injection ~ 3 weeks ago Level of assistance: Complete Independence Comments: Difficulty WB on the left at night   TODAY'S TREATMENT:                                                                                                                              DATE: 09/29/2022 Quadriceps sets 10X 5 seconds Seated straight leg raises 3 sets of 5 with 2# weight    PATIENT EDUCATION:  Education details: Quick review of exam findings and HEP Person educated: Patient Education method: Explanation, Demonstration, Tactile cues, Verbal cues, and Handouts Education comprehension: verbalized understanding, returned demonstration, verbal cues required, tactile cues required, and needs further education  HOME EXERCISE PROGRAM: Access Code: [facility] URL: [email or web address] Date: 09/29/2022 Prepared by: Vista Mink  Exercises - Supine Quadricep Sets  - 2 x daily - 7 x weekly - 2-3 sets - 10 reps - 5 second hold - Small Range Straight Leg Raise  - 2 x daily - 7 x weekly - 5 sets - 5 reps - 3 seconds hold  ASSESSMENT:  CLINICAL IMPRESSION: Patient is a 56 y.o. female who was seen today for physical therapy evaluation and treatment for  Diagnosis  M23.52 (ICD-10-CM) - Recurrent left knee instability  M25.562 (ICD-10-CM) - Acute pain of left knee  Jennifer Casey is very active, works out with a trainer 2X/week and takes great cycling vacations.  She has had to curtail her activities as a result of her recent left knee flare-up.  Quadriceps weakness is most evident and will be the focus of her supervised and home exercises.   OBJECTIVE  IMPAIRMENTS: Abnormal gait, decreased activity tolerance, decreased endurance, decreased knowledge of condition, difficulty walking, decreased strength, impaired perceived functional ability, obesity, and pain.   ACTIVITY LIMITATIONS: standing, squatting, stairs, and cycling  PARTICIPATION LIMITATIONS: community activity and workouts  PERSONAL FACTORS: Hyperglycemia, HLD, history of migraines, R shoulder and Bil knee pain are also affecting patient's functional outcome.   REHAB POTENTIAL: Good  CLINICAL DECISION MAKING: Stable/uncomplicated  EVALUATION COMPLEXITY: Low   GOALS: Goals reviewed with patient? Yes  SHORT TERM GOALS: Target date: 10/27/2022  Improve left quadriceps strength to 50+ pounds Baseline: 31.9 pounds Goal status: INITIAL  2.  Jennifer Casey will be independent with her day 1 HEP Baseline: Started 09/30/2022 Goal status: INITIAL   LONG TERM GOALS: Target date: 11/24/2022   Improve FOTO to 65 Baseline: 49 Goal status: INITIAL  2.  Jennifer Casey will report left knee pain consistently 0-3/10 on the VAS Baseline: 0-5/10 Goal status: INITIAL  3.  Improve Bil quadriceps strength to 80+ pounds Baseline: 31.9 left and 66.8 right Goal status: INITIAL  4.  Jennifer Casey will be independent with her long-term HEP at DC Baseline: Started 09/29/2022 Goal status: INITIAL  PLAN:  PT FREQUENCY: 1-2x/week  PT DURATION: 8 weeks  PLANNED INTERVENTIONS: Therapeutic exercises, Therapeutic activity, Neuromuscular re-education, Balance training, Gait training, Patient/Family education, Self Care, Stair training, and Cryotherapy  PLAN FOR NEXT SESSION: Review HEP, gym quadriceps strength progressions and appropriate activities to cue her to avoid left knee hyper-extension.   Farley Ly, PT, MPT 09/29/2022, 6:00 PM

## 2022-09-29 ENCOUNTER — Ambulatory Visit: Payer: 59 | Admitting: Rehabilitative and Restorative Service Providers"

## 2022-09-29 ENCOUNTER — Encounter: Payer: Self-pay | Admitting: Rehabilitative and Restorative Service Providers"

## 2022-09-29 DIAGNOSIS — M6281 Muscle weakness (generalized): Secondary | ICD-10-CM

## 2022-09-29 DIAGNOSIS — M25562 Pain in left knee: Secondary | ICD-10-CM

## 2022-09-29 DIAGNOSIS — R262 Difficulty in walking, not elsewhere classified: Secondary | ICD-10-CM

## 2022-10-07 ENCOUNTER — Encounter: Payer: Self-pay | Admitting: Rehabilitative and Restorative Service Providers"

## 2022-10-07 ENCOUNTER — Ambulatory Visit (INDEPENDENT_AMBULATORY_CARE_PROVIDER_SITE_OTHER): Payer: 59 | Admitting: Rehabilitative and Restorative Service Providers"

## 2022-10-07 DIAGNOSIS — M25562 Pain in left knee: Secondary | ICD-10-CM

## 2022-10-07 DIAGNOSIS — M6281 Muscle weakness (generalized): Secondary | ICD-10-CM

## 2022-10-07 DIAGNOSIS — R262 Difficulty in walking, not elsewhere classified: Secondary | ICD-10-CM | POA: Diagnosis not present

## 2022-10-07 NOTE — Therapy (Signed)
OUTPATIENT PHYSICAL THERAPY TREATMENT NOTE   Patient Name: Jennifer Casey MRN: [id number] DOB:[birth year], 56 y.o., female Today's Date: 10/07/2022  END OF SESSION:   PT End of Session - 10/07/22 0915     Visit Number 2    Number of Visits 12    PT Start Time 2025    PT Stop Time 4270    PT Time Calculation (min) 39 min    Activity Tolerance Patient tolerated treatment well;No increased pain    Behavior During Therapy [facility] for tasks assessed/performed             History reviewed. No pertinent past medical history. History reviewed. No pertinent surgical history. Patient Active Problem List   Diagnosis Date Noted   Hyperglycemia 07/07/2022   Hyperlipidemia 04/29/2022   Seasonal allergies 04/29/2022   Multiple lipomas 04/29/2022   History of migraine headaches 04/29/2022   Nephrolithiasis 04/29/2022   Fatty liver 04/28/2022   Vitamin D deficiency 04/28/2022   Pain in joint of right shoulder 03/19/2022   Family history of neoplasm of ovary 62/37/6283   Lichen sclerosus et atrophicus 10/25/2016   Abnormality of gait 10/08/2015   Knee pain, bilateral 05/08/2009     THERAPY DIAG:  Difficulty in walking, not elsewhere classified  Muscle weakness (generalized)  Left knee pain, unspecified chronicity   PCP: Binnie Rail, MD   REFERRING PROVIDER: Mcarthur Rossetti, MD   REFERRING DIAG:  Diagnosis  M23.52 (ICD-10-CM) - Recurrent left knee instability  M25.562 (ICD-10-CM) - Acute pain of left knee      THERAPY DIAG:  Difficulty in walking, not elsewhere classified   Muscle weakness (generalized)   Left knee pain, unspecified chronicity   Rationale for Evaluation and Treatment: Rehabilitation   ONSET DATE: ~ 3-4 weeks ago was the "final straw"   SUBJECTIVE:    SUBJECTIVE STATEMENT: Jennifer Casey notes she is having an easier time walking since starting PT.  She notes "almost not limping anymore."   PERTINENT HISTORY: Hyperglycemia, HLD, history of  migraines, R shoulder and Bil knee pain PAIN:  Are you having pain? Yes: NPRS scale: This week 0-6/10 Pain location: L medial knee Pain description: Has been sharp, ache Aggravating factors: Stairs, when falling Relieving factors: Cortisone, motrin   PRECAUTIONS: None   WEIGHT BEARING RESTRICTIONS: No   FALLS:  Has patient fallen in last 6 months? Yes. 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 Radial tear of the posterior horn of the medial meniscus towards the meniscal root. 2. Full-thickness cartilage loss of the medial patellofemoral compartment. 3. High-grade partial-thickness cartilage loss of the medial femoral condyle. Partial-thickness cartilage loss the medial tibial plateau.   X-rays show some mild  patellofemoral arthritis.  There is only mild OA without significant joint  narrowing of either compartment.  There is a spur off the posterior aspect  of the proximal tibia on lateral film.  No acute fracture noted.   PATIENT SURVEYS:  FOTO 49 (Goal 65 in 12 visits)   COGNITION: Overall cognitive status: Within functional limits for tasks assessed  SENSATION: No complaints of peripheral pain or paresthesias.     LOWER EXTREMITY ROM:   Active ROM Right 09/29/2022 Left 09/29/2022  Hip flexion      Hip extension      Hip abduction      Hip adduction      Hip internal rotation      Hip external rotation      Knee flexion 138 140  Knee extension 0 0-2 (hyper-extension)  Ankle dorsiflexion      Ankle plantarflexion      Ankle inversion      Ankle eversion       (Blank rows = not tested)   LOWER EXTREMITY STRENGTH:    STRENGTH Right 09/29/2022 Left 09/29/2022  Hip flexion      Hip extension      Hip abduction      Hip adduction      Hip internal rotation      Hip external rotation      Knee flexion      Knee extension 66.8 31.9   Ankle dorsiflexion      Ankle plantarflexion      Ankle inversion      Ankle eversion       (Blank rows = not tested)   GAIT: Distance walked: In the clinic Assistive device utilized:  None today or since a cortisone injection ~ 3 weeks ago Level of assistance: Complete Independence Comments: Difficulty WB on the left at night     TODAY'S TREATMENT:                                                                                                                              DATE:  10/07/2022 Recumbent bike Seat 2 for 5 minutes level 4 Quadriceps sets 10X 5 seconds Seated straight leg raises 3 sets of 5 with 3# weight  Heel Raises 10X slow eccentrics Hip hike 2 sets of 5 for 3 seconds  Functional Activities for sit to stand and steps: Step-down off 4 and 2 inch step with slow eccentrics 10X   09/29/2022 Quadriceps sets 10X 5 seconds Seated straight leg raises 3 sets of 5 with 2# weight      PATIENT EDUCATION:  Education details: Quick review of exam findings and HEP Person educated: Patient Education method: Explanation, Demonstration, Tactile cues, Verbal cues, and Handouts Education comprehension: verbalized understanding, returned demonstration, verbal cues required, tactile cues required, and needs further education   HOME EXERCISE PROGRAM: Access Code: [facility] URL: [email or web address] Date: 10/07/2022 Prepared by: Vista Mink  Exercises - Supine Quadricep Sets  - 2 x daily - 7 x weekly - 2-3 sets - 10 reps - 5 second hold - Small Range Straight Leg Raise  - 2 x daily - 7 x weekly - 5 sets - 5 reps - 3 seconds hold - Standing Hip Hiking  - 5 x daily - 7 x weekly - 1 sets - 5 reps - 3  seconds hold - Heel Raise  - 3-5 x daily - 7  x weekly - 1 sets - 10 reps - 3 seconds hold   ASSESSMENT:   CLINICAL IMPRESSION: Quadriceps weakness is most evident and will be the focus of Jennifer Casey's supervised and home exercises.  Hip abductors and calf weakness was also noted and activities to address this were noted today.  Continue POC to meet LTGs.   OBJECTIVE IMPAIRMENTS: Abnormal gait, decreased activity tolerance, decreased endurance, decreased knowledge of condition, difficulty walking, decreased strength, impaired perceived functional ability, obesity, and pain.    ACTIVITY LIMITATIONS: standing, squatting, stairs, and cycling   PARTICIPATION LIMITATIONS: community activity and workouts   PERSONAL FACTORS: Hyperglycemia, HLD, history of migraines, R shoulder and Bil knee pain are also affecting patient's functional outcome.    REHAB POTENTIAL: Good   CLINICAL DECISION MAKING: Stable/uncomplicated   EVALUATION COMPLEXITY: Low     GOALS: Goals reviewed with patient? Yes   SHORT TERM GOALS: Target date: 10/27/2022  Improve left quadriceps strength to 50+ pounds Baseline: 31.9 pounds Goal status: INITIAL   2.  Jennifer Casey will be independent with her day 1 HEP Baseline: Started 09/30/2022 Goal status: On Going 10/07/2022     LONG TERM GOALS: Target date: 11/24/2022    Improve FOTO to 65 Baseline: 49 Goal status: INITIAL   2.  Jennifer Casey will report left knee pain consistently 0-3/10 on the VAS Baseline: 0-5/10 Goal status: On Going 10/07/2022   3.  Improve Bil quadriceps strength to 80+ pounds Baseline: 31.9 left and 66.8 right Goal status: INITIAL   4.  Jennifer Casey will be independent with her long-term HEP at DC Baseline: Started 09/29/2022 Goal status: On Going 10/07/2022   PLAN:   PT FREQUENCY: 1-2x/week   PT DURATION: 8 weeks   PLANNED INTERVENTIONS: Therapeutic exercises, Therapeutic activity, Neuromuscular re-education, Balance training, Gait training, Patient/Family education, Self Care, Stair training, and  Cryotherapy   PLAN FOR NEXT SESSION: Review HEP, gym and home quadriceps strength progressions and appropriate activities to cue her to avoid left knee hyper-extension.    Farley Ly, PT, MPT 10/07/2022, 1:42 PM

## 2022-10-12 ENCOUNTER — Encounter: Payer: 59 | Admitting: Rehabilitative and Restorative Service Providers"

## 2022-10-19 ENCOUNTER — Ambulatory Visit: Payer: 59 | Admitting: Orthopaedic Surgery

## 2022-10-19 ENCOUNTER — Encounter: Payer: Self-pay | Admitting: Orthopaedic Surgery

## 2022-10-19 DIAGNOSIS — S83249A Other tear of medial meniscus, current injury, unspecified knee, initial encounter: Secondary | ICD-10-CM | POA: Diagnosis not present

## 2022-10-19 DIAGNOSIS — S83242D Other tear of medial meniscus, current injury, left knee, subsequent encounter: Secondary | ICD-10-CM

## 2022-10-19 DIAGNOSIS — M2352 Chronic instability of knee, left knee: Secondary | ICD-10-CM | POA: Diagnosis not present

## 2022-10-19 NOTE — Progress Notes (Signed)
The patient comes in with continued mechanical symptoms as it relates to her acute left knee medial meniscal tear.  We have tried conservative treatment with activity modification, rest, anti-inflammatories, steroid injection and even outpatient physical therapy to work on proprioception and balance.  She still has a lot of pain in the back of her knee especially with weightbearing.  On exam there is still a lot of pain with rotating the tibia on the femur and with hyperflexion of her left knee.  We talked in length in detail about her arthroscopic intervention at this point of her left knee given the very conservative treatment for well over 6 weeks including all of the a forementioned modalities.  She is still quite symptomatic with her meniscal tear and I believe this is the next appropriate treatment for her.  I explained in detail what the surgery involves.  I discussed the risk and benefits of the surgery as well.  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  All question concerns were answered and addressed.  Will be in touch with scheduling surgery and then would see her back in 1 week postoperative.

## 2022-10-20 ENCOUNTER — Encounter: Payer: Self-pay | Admitting: Rehabilitative and Restorative Service Providers"

## 2022-10-20 ENCOUNTER — Ambulatory Visit (INDEPENDENT_AMBULATORY_CARE_PROVIDER_SITE_OTHER): Payer: 59 | Admitting: Rehabilitative and Restorative Service Providers"

## 2022-10-20 DIAGNOSIS — M6281 Muscle weakness (generalized): Secondary | ICD-10-CM

## 2022-10-20 DIAGNOSIS — R262 Difficulty in walking, not elsewhere classified: Secondary | ICD-10-CM | POA: Diagnosis not present

## 2022-10-20 DIAGNOSIS — M25562 Pain in left knee: Secondary | ICD-10-CM

## 2022-10-20 NOTE — Therapy (Signed)
OUTPATIENT PHYSICAL THERAPY TREATMENT NOTE   Patient Name: Jennifer Casey MRN: [id number] DOB:1966/07/22, 56 y.o., female Today's Date: 10/20/2022  END OF SESSION:   PT End of Session - 10/20/22 1657     Visit Number 3    Number of Visits 12    PT Start Time 2035    PT Stop Time 5974    PT Time Calculation (min) 40 min    Activity Tolerance Patient tolerated treatment well;No increased pain    Behavior During Therapy [facility] for tasks assessed/performed              History reviewed. No pertinent past medical history. History reviewed. No pertinent surgical history. Patient Active Problem List   Diagnosis Date Noted   Hyperglycemia 07/07/2022   Hyperlipidemia 04/29/2022   Seasonal allergies 04/29/2022   Multiple lipomas 04/29/2022   History of migraine headaches 04/29/2022   Nephrolithiasis 04/29/2022   Fatty liver 04/28/2022   Vitamin D deficiency 04/28/2022   Pain in joint of right shoulder 03/19/2022   Family history of neoplasm of ovary 16/38/4536   Lichen sclerosus et atrophicus 10/25/2016   Abnormality of gait 10/08/2015   Knee pain, bilateral 05/08/2009     THERAPY DIAG:  Difficulty in walking, not elsewhere classified  Muscle weakness (generalized)  Left knee pain, unspecified chronicity   PCP: Binnie Rail, MD   REFERRING PROVIDER: Mcarthur Rossetti, MD   REFERRING DIAG:  Diagnosis  M23.52 (ICD-10-CM) - Recurrent left knee instability  M25.562 (ICD-10-CM) - Acute pain of left knee      THERAPY DIAG:  Difficulty in walking, not elsewhere classified   Muscle weakness (generalized)   Left knee pain, unspecified chronicity   Rationale for Evaluation and Treatment: Rehabilitation   ONSET DATE: ~ 3-4 weeks ago was the "final straw"   SUBJECTIVE:    SUBJECTIVE STATEMENT: [name] notes strength and functional progress since starting PT.  She is still on the fence about arthroscopy and will make up her mind in the next 2 weeks.    PERTINENT HISTORY: Hyperglycemia, HLD, history of migraines, R shoulder and Bil knee pain PAIN:  Are you having pain? Yes: NPRS scale: This week 0-6/10 Pain location: L medial knee Pain description: Has been sharp, ache Aggravating factors: Stairs, when falling Relieving factors: Cortisone, motrin   PRECAUTIONS: None   WEIGHT BEARING RESTRICTIONS: No   FALLS:  Has patient fallen in last 6 months? Yes. 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 Radial tear of the posterior horn of the medial meniscus towards the meniscal root. 2. Full-thickness cartilage loss of the medial patellofemoral compartment. 3. High-grade partial-thickness cartilage loss of the medial femoral condyle. Partial-thickness cartilage loss the medial tibial plateau.   X-rays show some mild  patellofemoral arthritis.  There is only mild OA without significant joint  narrowing of either compartment.  There is a spur off the posterior aspect  of the proximal tibia on lateral film.  No acute fracture noted.   PATIENT SURVEYS:  FOTO 49 (Goal 65 in 12 visits)   COGNITION: Overall cognitive status: Within functional limits for tasks assessed  SENSATION: No complaints of peripheral pain or paresthesias.     LOWER EXTREMITY ROM:   Active ROM Right 09/29/2022 Left 09/29/2022  Hip flexion      Hip extension      Hip abduction      Hip adduction      Hip internal rotation      Hip external rotation      Knee flexion 138 140  Knee extension 0 0-2 (hyper-extension)  Ankle dorsiflexion      Ankle plantarflexion      Ankle inversion      Ankle eversion       (Blank rows =  not tested)   LOWER EXTREMITY STRENGTH:   STRENGTH (in pounds assessed with hand-held dynamometer) Right 09/29/2022 Left 09/29/2022 Left 10/20/2022  Hip flexion       Hip extension       Hip abduction       Hip adduction       Hip internal rotation       Hip external rotation       Knee flexion       Knee extension 66.8 31.9  48.3  Ankle dorsiflexion       Ankle plantarflexion       Ankle inversion       Ankle eversion        (Blank rows = not tested)   GAIT: Distance walked: In the clinic Assistive device utilized:  None today or since a cortisone injection ~ 3 weeks ago Level of assistance: Complete Independence Comments: Difficulty WB on the left at night     TODAY'S TREATMENT:                                                                                                                              DATE:  10/20/2022  Quadriceps sets 10X 5 seconds Seated straight leg raises 2 sets of 5 and 1 set of 10 with 3# weight  Heel Raises 10X slow eccentrics Hip hike 2 sets of 5 for 3 seconds  Functional Activities for sit to stand and steps: Step-down off 4 and 2 inch step with slow eccentrics 10X Discussion of knee anatomy, goals of arthroscopy and what PT can and can't do   10/07/2022 Recumbent bike Seat 2 for 5 minutes level 4 Quadriceps sets 10X 5 seconds Seated straight leg raises 3 sets of 5 with 3# weight  Heel Raises 10X slow eccentrics Hip hike 2 sets of 5 for 3 seconds  Functional Activities for sit to stand and steps: Step-down off 4 and 2 inch step with slow eccentrics 10X   09/29/2022 Quadriceps sets 10X 5 seconds Seated straight leg raises 3 sets of 5 with 2# weight      PATIENT EDUCATION:  Education details: Quick review of exam findings and HEP Person educated: Patient Education method: Explanation, Demonstration, Tactile cues, Verbal cues, and Handouts Education comprehension: verbalized understanding, returned demonstration, verbal cues  required, tactile cues required, and needs further education   HOME EXERCISE PROGRAM: Access Code: [facility] URL: [email or web address] Date: 10/07/2022 Prepared by: Vista Mink  Exercises - Supine Quadricep Sets  - 2 x daily - 7 x weekly - 2-3 sets - 10 reps - 5 second hold - Small Range Straight Leg Raise  - 2 x daily - 7 x weekly - 5 sets - 5 reps - 3 seconds hold - Standing Hip Hiking  - 5 x daily - 7 x weekly - 1 sets - 5 reps - 3 seconds hold - Heel Raise  - 3-5 x daily - 7 x weekly - 1 sets - 10 reps - 3 seconds hold   ASSESSMENT:   CLINICAL IMPRESSION: Quadriceps strengthening continues to be the focus of [name]'s supervised and home exercises.  Although she notes significant progress since starting physical therapy, she is still trying to decide whether or not she wants to undergo knee arthroscopy.  We had a long discussion about the anatomy of the knee, benefits of PT, and what we can and cannot do.  [name] has decided to continue to work on her exercises over the next 2 weeks and she will make a decision then as to whether or not she will undergo surgery.  We will plan on doing a reassessment before her 11/04/2022 scheduled surgery (which Dr. Ninfa Linden told her she could cancel at any time).  Ideally, we will do the reassessment about a week before the scheduled surgery so that she can give adequate notice if she does need to cancel the procedure.   OBJECTIVE IMPAIRMENTS: Abnormal gait, decreased activity tolerance, decreased endurance, decreased knowledge of condition, difficulty walking, decreased strength, impaired perceived functional ability, obesity, and pain.    ACTIVITY LIMITATIONS: standing, squatting, stairs, and cycling   PARTICIPATION LIMITATIONS: community activity and workouts   PERSONAL FACTORS: Hyperglycemia, HLD, history of migraines, R shoulder and Bil knee pain are also affecting patient's functional outcome.    REHAB POTENTIAL: Good   CLINICAL  DECISION MAKING: Stable/uncomplicated   EVALUATION COMPLEXITY: Low     GOALS: Goals reviewed with patient? Yes   SHORT TERM GOALS: Target date: 10/27/2022  Improve left quadriceps strength to 50+ pounds Baseline: 31.9 pounds Goal status: On Going (improved) 10/20/2022   2.  [name] will be independent with her day 1 HEP Baseline: Started 09/30/2022 Goal status: On Going 10/20/2022     LONG TERM GOALS: Target date: 11/24/2022    Improve FOTO to 65 Baseline: 49 Goal status: INITIAL   2.  [name] will report left knee pain consistently 0-3/10 on the VAS Baseline: 0-5/10 Goal status: On Going 10/20/2022   3.  Improve Bil quadriceps strength to 80+ pounds Baseline: 31.9 left and 66.8 right Goal status: On Going 10/20/2022   4.  [name] will be independent with her long-term HEP at DC Baseline: Started 09/29/2022 Goal status: On Going 10/20/2022   PLAN:   PT FREQUENCY: 1-2x/week   PT DURATION: 8 weeks   PLANNED INTERVENTIONS: Therapeutic exercises, Therapeutic activity, Neuromuscular re-education, Balance training, Gait training, Patient/Family education, Self Care, Stair training, and Cryotherapy   PLAN FOR NEXT SESSION: Review HEP, make appropriate quadriceps strength progressions and continue to cue her to avoid left knee hyper-extension.    Farley Ly, PT, MPT 10/20/2022, 5:03 PM

## 2022-10-26 NOTE — Patient Instructions (Signed)
DUE TO COVID-19 ONLY TWO VISITORS  (aged 56 and older)  ARE ALLOWED TO COME WITH YOU AND STAY IN THE WAITING ROOM ONLY DURING PRE OP AND PROCEDURE.   **NO VISITORS ARE ALLOWED IN THE SHORT STAY AREA OR RECOVERY ROOM!!**  IF YOU WILL BE ADMITTED INTO THE HOSPITAL YOU ARE ALLOWED ONLY FOUR SUPPORT PEOPLE DURING VISITATION HOURS ONLY (7 AM -8PM)   The support person(s) must pass our screening, gel in and out, and wear a mask at all times, including in the patient's room. Patients must also wear a mask when staff or their support person are in the room. Visitors GUEST BADGE MUST BE WORN VISIBLY  One adult visitor may remain with you overnight and MUST be in the room by 8 P.M.     Your procedure is scheduled on: 11/04/22   Report to [facility] Main Entrance    Report to admitting at : 8:45 AM   Call this number if you have problems the morning of surgery [phone]   Do not eat food :After Midnight.  NO SOLID FOOD AFTER MIDNIGHT THE NIGHT PRIOR TO SURGERY. NOTHING BY MOUTH EXCEPT CLEAR LIQUIDS UNTIL . PLEASE FINISH ENSURE DRINK PER SURGEON ORDER  WHICH NEEDS TO BE COMPLETED AT : 8:00 AM.    Water Black Coffee (sugar ok, NO MILK/CREAM OR CREAMERS)  Tea (sugar ok, NO MILK/CREAM OR CREAMERS) regular and decaf                             Plain Jell-O (NO RED)                                           Fruit ices (not with fruit pulp, NO RED)                                     Popsicles (NO RED)                                                                  Juice: apple, WHITE grape, WHITE cranberry Sports drinks like Gatorade (NO RED)              Oral Hygiene is also important to reduce your risk of infection.                                    Remember - BRUSH YOUR TEETH THE MORNING OF SURGERY WITH YOUR REGULAR TOOTHPASTE  DENTURES WILL BE REMOVED PRIOR TO SURGERY PLEASE DO NOT APPLY "Poly grip" OR ADHESIVES!!!   Do NOT smoke after Midnight   Take these medicines the  morning of surgery with A SIP OF WATER: Loratadine  DO NOT TAKE ANY ORAL DIABETIC MEDICATIONS DAY OF YOUR SURGERY  Bring CPAP mask and tubing day of surgery.  You may not have any metal on your body including hair pins, jewelry, and body piercing             Do not wear make-up, lotions, powders, perfumes/cologne, or deodorant  Do not wear nail polish including gel and S&S, artificial/acrylic nails, or any other type of covering on natural nails including finger and toenails. If you have artificial nails, gel coating, etc. that needs to be removed by a nail salon please have this removed prior to surgery or surgery may need to be canceled/ delayed if the surgeon/ anesthesia feels like they are unable to be safely monitored.   Do not shave  48 hours prior to surgery.    Do not bring valuables to the hospital. [place].   Contacts, glasses, or bridgework may not be worn into surgery.   Bring small overnight bag day of surgery.   DO NOT [place]. PHARMACY WILL DISPENSE MEDICATIONS LISTED ON YOUR MEDICATION LIST TO YOU DURING YOUR ADMISSION [place]!    Patients discharged on the day of surgery will not be allowed to drive home.  Someone NEEDS to stay with you for the first 24 hours after anesthesia.   Special Instructions: Bring a copy of your healthcare power of attorney and living will documents         the day of surgery if you haven't scanned them before.              Please read over the following fact sheets you were given: IF YOU HAVE QUESTIONS ABOUT YOUR PRE-OP INSTRUCTIONS PLEASE CALL [phone]    [facility] - Preparing for Surgery Before surgery, you can play an important role.  Because skin is not sterile, your skin needs to be as free of germs as possible.  You can reduce the number of germs on your skin by washing with CHG (chlorahexidine gluconate) soap  before surgery.  CHG is an antiseptic cleaner which kills germs and bonds with the skin to continue killing germs even after washing. Please DO NOT use if you have an allergy to CHG or antibacterial soaps.  If your skin becomes reddened/irritated stop using the CHG and inform your nurse when you arrive at Short Stay. Do not shave (including legs and underarms) for at least 48 hours prior to the first CHG shower.  You may shave your face/neck. Please follow these instructions carefully:  1.  Shower with CHG Soap the night before surgery and the  morning of Surgery.  2.  If you choose to wash your hair, wash your hair first as usual with your  normal  shampoo.  3.  After you shampoo, rinse your hair and body thoroughly to remove the  shampoo.                           4.  Use CHG as you would any other liquid soap.  You can apply chg directly  to the skin and wash                       Gently with a scrungie or clean washcloth.  5.  Apply the CHG Soap to your body ONLY FROM THE NECK DOWN.   Do not use on face/ open  Wound or open sores. Avoid contact with eyes, ears mouth and genitals (private parts).                       Wash face,  Genitals (private parts) with your normal soap.             6.  Wash thoroughly, paying special attention to the area where your surgery  will be performed.  7.  Thoroughly rinse your body with warm water from the neck down.  8.  DO NOT shower/wash with your normal soap after using and rinsing off  the CHG Soap.                9.  Pat yourself dry with a clean towel.            10.  Wear clean pajamas.            11.  Place clean sheets on your bed the night of your first shower and do not  sleep with pets. Day of Surgery : Do not apply any lotions/deodorants the morning of surgery.  Please wear clean clothes to the hospital/surgery center.  FAILURE TO FOLLOW THESE INSTRUCTIONS MAY RESULT IN THE CANCELLATION OF YOUR SURGERY PATIENT  SIGNATURE_________________________________  NURSE SIGNATURE__________________________________  ________________________________________________________________________   Adam Phenix  An incentive spirometer is a tool that can help keep your lungs clear and active. This tool measures how well you are filling your lungs with each breath. Taking long deep breaths may help reverse or decrease the chance of developing breathing (pulmonary) problems (especially infection) following: A long period of time when you are unable to move or be active. BEFORE THE PROCEDURE  If the spirometer includes an indicator to show your best effort, your nurse or respiratory therapist will set it to a desired goal. If possible, sit up straight or lean slightly forward. Try not to slouch. Hold the incentive spirometer in an upright position. INSTRUCTIONS FOR USE  Sit on the edge of your bed if possible, or sit up as far as you can in bed or on a chair. Hold the incentive spirometer in an upright position. Breathe out normally. Place the mouthpiece in your mouth and seal your lips tightly around it. Breathe in slowly and as deeply as possible, raising the piston or the ball toward the top of the column. Hold your breath for 3-5 seconds or for as long as possible. Allow the piston or ball to fall to the bottom of the column. Remove the mouthpiece from your mouth and breathe out normally. Rest for a few seconds and repeat Steps 1 through 7 at least 10 times every 1-2 hours when you are awake. Take your time and take a few normal breaths between deep breaths. The spirometer may include an indicator to show your best effort. Use the indicator as a goal to work toward during each repetition. After each set of 10 deep breaths, practice coughing to be sure your lungs are clear. If you have an incision (the cut made at the time of surgery), support your incision when coughing by placing a pillow or rolled up towels  firmly against it. Once you are able to get out of bed, walk around indoors and cough well. You may stop using the incentive spirometer when instructed by your caregiver.  RISKS AND COMPLICATIONS Take your time so you do not get dizzy or light-headed. If you are in pain, you may need to take or  ask for pain medication before doing incentive spirometry. It is harder to take a deep breath if you are having pain. AFTER USE Rest and breathe slowly and easily. It can be helpful to keep track of a log of your progress. Your caregiver can provide you with a simple table to help with this. If you are using the spirometer at home, follow these instructions: [place] IF:  You are having difficultly using the spirometer. You have trouble using the spirometer as often as instructed. Your pain medication is not giving enough relief while using the spirometer. You develop fever of 100.5 F (38.1 C) or higher. SEEK IMMEDIATE MEDICAL CARE IF:  You cough up bloody sputum that had not been present before. You develop fever of 102 F (38.9 C) or greater. You develop worsening pain at or near the incision site. MAKE SURE YOU:  Understand these instructions. Will watch your condition. Will get help right away if you are not doing well or get worse. Document Released: 03/21/2007 Document Revised: 01/31/2012 Document Reviewed: 05/22/2007 [facility] Patient Information 2014 [place], [place].   ________________________________________________________________________

## 2022-10-27 ENCOUNTER — Encounter (HOSPITAL_COMMUNITY)
Admission: RE | Admit: 2022-10-27 | Discharge: 2022-10-27 | Disposition: A | Discharge status: Home / Self Care | Payer: 59 | Source: Ambulatory Visit | Attending: Orthopaedic Surgery | Admitting: Orthopaedic Surgery

## 2022-10-27 ENCOUNTER — Other Ambulatory Visit: Payer: Self-pay | Admitting: Physician Assistant

## 2022-10-27 ENCOUNTER — Encounter (HOSPITAL_COMMUNITY): Payer: Self-pay

## 2022-10-27 ENCOUNTER — Other Ambulatory Visit: Payer: Self-pay

## 2022-10-27 VITALS — BP 121/72 | HR 66 | Temp 98.1°F | Ht 59.0 in | Wt 190.0 lb

## 2022-10-27 DIAGNOSIS — Z01818 Encounter for other preprocedural examination: Secondary | ICD-10-CM

## 2022-10-27 DIAGNOSIS — Z01812 Encounter for preprocedural laboratory examination: Secondary | ICD-10-CM | POA: Diagnosis not present

## 2022-10-27 HISTORY — DX: Unspecified osteoarthritis, unspecified site: M19.90

## 2022-10-27 HISTORY — DX: Personal history of urinary calculi: Z87.442

## 2022-10-27 LAB — CBC
HCT: 43 % (ref 36.0–46.0)
Hemoglobin: 14.3 g/dL (ref 12.0–15.0)
MCH: 31.3 pg (ref 26.0–34.0)
MCHC: 33.3 g/dL (ref 30.0–36.0)
MCV: 94.1 fL (ref 80.0–100.0)
Platelets: 244 10*3/uL (ref 150–400)
RBC: 4.57 MIL/uL (ref 3.87–5.11)
RDW: 12.2 % (ref 11.5–15.5)
WBC: 6 10*3/uL (ref 4.0–10.5)
nRBC: 0 % (ref 0.0–0.2)

## 2022-10-27 NOTE — Progress Notes (Signed)
For Short Stay: [place] appointment date:  Bowel Prep reminder:   For Anesthesia: PCP - Dr.Stacy Burns Cardiologist -   Chest x-ray -  EKG -  Stress Test -  ECHO -  Cardiac Cath -  Pacemaker/ICD device last checked: Pacemaker orders received: Device Rep notified:  Spinal Cord Stimulator:  Sleep Study -  CPAP -   Fasting Blood Sugar -  Checks Blood Sugar _____ times a day Date and result of last Hgb A1c-  Last dose of GLP1 agonist-  GLP1 instructions:   Last dose of SGLT-2 inhibitors-  SGLT-2 instructions:   Blood Thinner Instructions: Aspirin Instructions: Last Dose:  Activity level: Can go up a flight of stairs and activities of daily living without stopping and without chest pain and/or shortness of breath   Able to exercise without chest pain and/or shortness of breath   Unable to go up a flight of stairs without chest pain and/or shortness of breath     Anesthesia review:   Patient denies shortness of breath, fever, cough and chest pain at PAT appointment   Patient verbalized understanding of instructions that were given to them at the PAT appointment. Patient was also instructed that they will need to review over the PAT instructions again at home before surgery.

## 2022-11-03 ENCOUNTER — Encounter: Payer: Self-pay | Admitting: Rehabilitative and Restorative Service Providers"

## 2022-11-03 ENCOUNTER — Ambulatory Visit: Payer: 59 | Admitting: Rehabilitative and Restorative Service Providers"

## 2022-11-03 DIAGNOSIS — M6281 Muscle weakness (generalized): Secondary | ICD-10-CM | POA: Diagnosis not present

## 2022-11-03 DIAGNOSIS — R262 Difficulty in walking, not elsewhere classified: Secondary | ICD-10-CM

## 2022-11-03 DIAGNOSIS — M25562 Pain in left knee: Secondary | ICD-10-CM | POA: Diagnosis not present

## 2022-11-03 NOTE — Therapy (Signed)
OUTPATIENT PHYSICAL THERAPY TREATMENT/DISCHARGE NOTE   Patient Name: Jennifer Casey MRN: [id number] DOB:07-02-1966, 56 y.o., female Today's Date: 11/03/2022  PHYSICAL THERAPY DISCHARGE SUMMARY  Visits from Start of Care: 4  Current functional level related to goals / functional outcomes: See note   Remaining deficits: See note   Education / Equipment: Updated HEP for post-surgery   Patient agrees to discharge. Patient goals were partially met. Patient is being discharged due to  pre-surgical strengthening accomplished.   END OF SESSION:   PT End of Session - 11/03/22 1345     Visit Number 4    Number of Visits 12    PT Start Time 1027    PT Stop Time 1432    PT Time Calculation (min) 48 min    Activity Tolerance Patient tolerated treatment well;No increased pain    Behavior During Therapy [facility] for tasks assessed/performed               Past Medical History:  Diagnosis Date   Arthritis    History of kidney stones    Past Surgical History:  Procedure Laterality Date   COLONOSCOPY     DILATION AND CURETTAGE OF UTERUS     Patient Active Problem List   Diagnosis Date Noted   Hyperglycemia 07/07/2022   Hyperlipidemia 04/29/2022   Seasonal allergies 04/29/2022   Multiple lipomas 04/29/2022   History of migraine headaches 04/29/2022   Nephrolithiasis 04/29/2022   Fatty liver 04/28/2022   Vitamin D deficiency 04/28/2022   Pain in joint of right shoulder 03/19/2022   Family history of neoplasm of ovary 25/36/6440   Lichen sclerosus et atrophicus 10/25/2016   Abnormality of gait 10/08/2015   Knee pain, bilateral 05/08/2009     THERAPY DIAG:  Difficulty in walking, not elsewhere classified  Muscle weakness (generalized)  Left knee pain, unspecified chronicity   PCP: Binnie Rail, MD   REFERRING PROVIDER: Mcarthur Rossetti, MD   REFERRING DIAG:  Diagnosis  M23.52 (ICD-10-CM) - Recurrent left knee instability  M25.562 (ICD-10-CM) - Acute  pain of left knee      THERAPY DIAG:  Difficulty in walking, not elsewhere classified   Muscle weakness (generalized)   Left knee pain, unspecified chronicity   Rationale for Evaluation and Treatment: Rehabilitation   ONSET DATE: ~ 3-4 weeks ago was the "final straw"   SUBJECTIVE:    SUBJECTIVE STATEMENT: [name] notes that she has made strength and functional progress since starting PT.  She decided to undergo Lt knee arthroscopy tomorrow.   PERTINENT HISTORY: Hyperglycemia, HLD, history of migraines, R shoulder and Bil knee pain PAIN:  Are you having pain? Yes: NPRS scale: This week 0-6/10 Pain location: L medial knee Pain description: Has been sharp, ache Aggravating factors: Stairs, when falling Relieving factors: Cortisone, motrin   PRECAUTIONS: None   WEIGHT BEARING RESTRICTIONS: No   FALLS:  Has patient fallen in last 6 months? Yes. 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 Radial tear of the posterior horn of the medial meniscus towards the meniscal root. 2. Full-thickness cartilage loss of the medial patellofemoral compartment. 3. High-grade partial-thickness cartilage loss of the medial femoral  condyle. Partial-thickness cartilage loss the medial tibial plateau.   X-rays show some mild  patellofemoral arthritis.  There is only mild OA without significant joint  narrowing of either compartment.  There is a spur off the posterior aspect  of the proximal tibia on lateral film.  No acute fracture noted.   PATIENT SURVEYS:  11/03/2022 FOTO 38 (Goal 65)  09/29/2022 FOTO 49 (Goal 65 in 12 visits)   COGNITION: Overall cognitive status: Within  functional limits for tasks assessed                         SENSATION: No complaints of peripheral pain or paresthesias.     LOWER EXTREMITY ROM:   Active ROM Right 09/29/2022 Left 09/29/2022 Left 11/03/2022  Hip flexion       Hip extension       Hip abduction       Hip adduction       Hip internal rotation       Hip external rotation       Knee flexion 138 140 128  Knee extension 0 0-2 (hyper-extension) Neutral (0)  Ankle dorsiflexion       Ankle plantarflexion       Ankle inversion       Ankle eversion        (Blank rows = not tested)   LOWER EXTREMITY STRENGTH:   STRENGTH (in pounds assessed with hand-held dynamometer) Right 09/29/2022 Left 09/29/2022 Left 10/20/2022 Left 11/03/2022  Hip flexion        Hip extension        Hip abduction        Hip adduction        Hip internal rotation        Hip external rotation        Knee flexion        Knee extension 66.8 31.9  48.3 50.1  Ankle dorsiflexion        Ankle plantarflexion        Ankle inversion        Ankle eversion         (Blank rows = not tested)   GAIT: Distance walked: In the clinic Assistive device utilized:  None today or since a cortisone injection ~ 3 weeks ago Level of assistance: Complete Independence Comments: Difficulty WB on the left at night     TODAY'S TREATMENT:                                                                                                                              DATE:  11/03/2022 Quadriceps 3 sets of 10X 5 seconds Seated straight leg raises 3 sets of 10 and with 3# weight  Heel Raises 10X slow eccentrics -Hip hike 2 sets of 5 for 3 seconds  Vasopneumatic Left knee 10 minutes Medium Pressure 34*  Review post-surgical HEP, recommendations (ice) and reassessment findings   10/20/2022  Quadriceps sets 10X 5 seconds Seated straight leg raises 2 sets of 5 and 1 set of 10 with 3# weight  Heel Raises 10X slow eccentrics Hip hike 2 sets of 5 for 3 seconds  Functional  Activities for sit to stand and steps: Step-down off 4 and 2 inch step with slow eccentrics 10X Discussion of knee anatomy, goals of arthroscopy and what PT can and can't do   10/07/2022 Recumbent bike Seat 2 for 5 minutes level 4 Quadriceps sets 10X 5 seconds Seated straight leg raises 3 sets of 5 with 3# weight  Heel Raises 10X slow eccentrics Hip hike 2 sets of 5 for 3 seconds  Functional Activities for sit to stand and steps: Step-down off 4 and 2 inch step with slow eccentrics 10X   09/29/2022 Quadriceps sets 10X 5 seconds Seated straight leg raises 3 sets of 5 with 2# weight      PATIENT EDUCATION:  Education details: Quick review of exam findings and HEP Person educated: Patient Education method: Explanation, Demonstration, Tactile cues, Verbal cues, and Handouts Education comprehension: verbalized understanding, returned demonstration, verbal cues required, tactile cues required, and needs further education   HOME EXERCISE PROGRAM: Access Code: [facility] URL: [email or web address] Date: 11/03/2022 Prepared by: Vista Mink  Exercises - Supine Quadricep Sets  - 2 x daily - 7 x weekly - 2-3 sets - 10 reps - 5 second hold - Small Range Straight Leg Raise  - 2 x daily - 7 x weekly - 5 sets - 5 reps - 3 seconds hold - Standing Hip Hiking  - 5 x daily - 7 x weekly - 1 sets - 5 reps - 3 seconds hold - Heel Raise  - 3-5 x daily - 7 x weekly - 1 sets - 10 reps - 3 seconds hold - Lateral Step Down  - 1 x daily - 2-3 x weekly - 2-3 sets - 10 reps   ASSESSMENT:   CLINICAL IMPRESSION: Quadriceps strength has made significant progress in only 4 PT visits.  Because she is not able to do what she would like functionally and because she feels stronger, [name] has decided to undergo arthroscopy tomorrow.  We discussed the priorities with her home exercise program remain quadriceps strengthening and edema control postsurgery.  We also discussed the importance of frequent  ice use, particularly for the first 72 hours after surgery and then again for the first 30 days postsurgery.  [name] will follow-up with physical therapy when it is appropriate per Dr. Ninfa Linden.  She is ready to be discharged from supervised physical therapy at this time.   OBJECTIVE IMPAIRMENTS: Abnormal gait, decreased activity tolerance, decreased endurance, decreased knowledge of condition, difficulty walking, decreased strength, impaired perceived functional ability, obesity, and pain.    ACTIVITY LIMITATIONS: standing, squatting, stairs, and cycling   PARTICIPATION LIMITATIONS: community activity and workouts   PERSONAL FACTORS: Hyperglycemia, HLD, history of migraines, R shoulder and Bil knee pain are also affecting patient's functional outcome.    REHAB POTENTIAL: Good   CLINICAL DECISION MAKING: Stable/uncomplicated   EVALUATION COMPLEXITY: Low     GOALS: Goals reviewed with patient? Yes   SHORT TERM GOALS: Target date: 10/27/2022  Improve left quadriceps strength to 50+ pounds Baseline: 31.9 pounds Goal status: Met 11/03/2022   2.  [name] will be independent with her day 1 HEP Baseline: Started 09/30/2022 Goal status: Met 11/03/2022     LONG TERM GOALS: Target date: 11/24/2022    Improve FOTO to 65 Baseline: 49 Goal  status: On Going 11/03/2022   2.  [name] will report left knee pain consistently 0-3/10 on the VAS Baseline: 0-5/10 Goal status: On Going 11/03/2022   3.  Improve Bil quadriceps strength to 80+ pounds Baseline: 31.9 left and 66.8 right Goal status: On Going 11/03/2022   4.  [name] will be independent with her long-term HEP at DC Baseline: Started 09/29/2022 Goal status: On Going 11/03/2022   PLAN:   PT FREQUENCY: DC   PT DURATION: DC   PLANNED INTERVENTIONS: Therapeutic exercises, Therapeutic activity, Neuromuscular re-education, Balance training, Gait training, Patient/Family education, Self Care, Stair training, and Cryotherapy   PLAN FOR  NEXT SESSION: DC    Farley Ly, PT, MPT 11/03/2022, 2:46 PM

## 2022-11-04 ENCOUNTER — Other Ambulatory Visit: Payer: Self-pay

## 2022-11-04 ENCOUNTER — Ambulatory Visit (HOSPITAL_COMMUNITY)
Admission: RE | Admit: 2022-11-04 | Discharge: 2022-11-04 | Disposition: A | Discharge status: Home / Self Care | Payer: 59 | Attending: Orthopaedic Surgery | Admitting: Orthopaedic Surgery

## 2022-11-04 ENCOUNTER — Other Ambulatory Visit (HOSPITAL_COMMUNITY): Payer: Self-pay

## 2022-11-04 ENCOUNTER — Ambulatory Visit (HOSPITAL_BASED_OUTPATIENT_CLINIC_OR_DEPARTMENT_OTHER): Payer: 59 | Admitting: Certified Registered"

## 2022-11-04 ENCOUNTER — Encounter (HOSPITAL_COMMUNITY)
Admission: RE | Disposition: A | Discharge status: Home / Self Care | Payer: Self-pay | Source: Home / Self Care | Attending: Orthopaedic Surgery

## 2022-11-04 ENCOUNTER — Encounter (HOSPITAL_COMMUNITY): Payer: Self-pay | Admitting: Orthopaedic Surgery

## 2022-11-04 ENCOUNTER — Ambulatory Visit (HOSPITAL_COMMUNITY): Payer: 59 | Admitting: Certified Registered"

## 2022-11-04 DIAGNOSIS — S83202A Bucket-handle tear of unspecified meniscus, current injury, unspecified knee, initial encounter: Secondary | ICD-10-CM | POA: Diagnosis not present

## 2022-11-04 DIAGNOSIS — S838X2A Sprain of other specified parts of left knee, initial encounter: Secondary | ICD-10-CM

## 2022-11-04 DIAGNOSIS — S838X2D Sprain of other specified parts of left knee, subsequent encounter: Secondary | ICD-10-CM

## 2022-11-04 DIAGNOSIS — R519 Headache, unspecified: Secondary | ICD-10-CM | POA: Insufficient documentation

## 2022-11-04 DIAGNOSIS — M94262 Chondromalacia, left knee: Secondary | ICD-10-CM | POA: Diagnosis not present

## 2022-11-04 DIAGNOSIS — M2540 Effusion, unspecified joint: Secondary | ICD-10-CM | POA: Diagnosis not present

## 2022-11-04 DIAGNOSIS — S83232A Complex tear of medial meniscus, current injury, left knee, initial encounter: Secondary | ICD-10-CM | POA: Insufficient documentation

## 2022-11-04 DIAGNOSIS — M1990 Unspecified osteoarthritis, unspecified site: Secondary | ICD-10-CM | POA: Insufficient documentation

## 2022-11-04 DIAGNOSIS — S83241A Other tear of medial meniscus, current injury, right knee, initial encounter: Secondary | ICD-10-CM | POA: Insufficient documentation

## 2022-11-04 HISTORY — DX: Family history of other specified conditions: Z84.89

## 2022-11-04 HISTORY — PX: KNEE ARTHROSCOPY WITH MEDIAL MENISECTOMY: SHX5651

## 2022-11-04 SURGERY — ARTHROSCOPY, KNEE, WITH MEDIAL MENISCECTOMY
Anesthesia: General | Site: Knee | Laterality: Left

## 2022-11-04 MED ORDER — MORPHINE SULFATE (PF) 4 MG/ML IV SOLN
INTRAVENOUS | Status: DC | PRN
  Administered 2022-11-04: 4 mg

## 2022-11-04 MED ORDER — FENTANYL CITRATE (PF) 100 MCG/2ML IJ SOLN
INTRAMUSCULAR | Status: DC | PRN
  Administered 2022-11-04 (×2): 50 ug via INTRAVENOUS

## 2022-11-04 MED ORDER — ONDANSETRON HCL 4 MG/2ML IJ SOLN
INTRAMUSCULAR | Status: DC | PRN
  Administered 2022-11-04: 4 mg via INTRAVENOUS

## 2022-11-04 MED ORDER — KETOROLAC TROMETHAMINE 15 MG/ML IJ SOLN
INTRAMUSCULAR | Status: AC
  Filled 2022-11-04: qty 1

## 2022-11-04 MED ORDER — MIDAZOLAM HCL 2 MG/2ML IJ SOLN
INTRAMUSCULAR | Status: AC
  Filled 2022-11-04: qty 2

## 2022-11-04 MED ORDER — ACETAMINOPHEN 160 MG/5ML PO SOLN: 325.0000 mg | ORAL | Status: DC | PRN

## 2022-11-04 MED ORDER — CEFAZOLIN SODIUM-DEXTROSE 2-4 GM/100ML-% IV SOLN
2.0000 g | INTRAVENOUS | Status: AC
  Administered 2022-11-04: 2 g via INTRAVENOUS
  Filled 2022-11-04: qty 100

## 2022-11-04 MED ORDER — KETOROLAC TROMETHAMINE 15 MG/ML IJ SOLN
15.0000 mg | Freq: Once | INTRAMUSCULAR | Status: AC | PRN
  Administered 2022-11-04: 15 mg via INTRAVENOUS

## 2022-11-04 MED ORDER — BUPIVACAINE HCL (PF) 0.5 % IJ SOLN
INTRAMUSCULAR | Status: DC | PRN
  Administered 2022-11-04: 30 mL

## 2022-11-04 MED ORDER — ATROPINE SULFATE 0.4 MG/ML IV SOLN
INTRAVENOUS | Status: AC
  Filled 2022-11-04: qty 2

## 2022-11-04 MED ORDER — CELECOXIB 200 MG PO CAPS
200.0000 mg | ORAL_CAPSULE | Freq: Once | ORAL | Status: AC
  Administered 2022-11-04: 200 mg via ORAL
  Filled 2022-11-04: qty 1

## 2022-11-04 MED ORDER — LACTATED RINGERS IV SOLN: INTRAVENOUS | Status: DC

## 2022-11-04 MED ORDER — ACETAMINOPHEN 325 MG PO TABS: 325.0000 mg | ORAL_TABLET | ORAL | Status: DC | PRN

## 2022-11-04 MED ORDER — ORAL CARE MOUTH RINSE: 15.0000 mL | Freq: Once | OROMUCOSAL | Status: AC

## 2022-11-04 MED ORDER — OXYCODONE HCL 5 MG PO TABS: 5.0000 mg | ORAL_TABLET | Freq: Once | ORAL | Status: AC | PRN

## 2022-11-04 MED ORDER — OXYCODONE HCL 5 MG/5ML PO SOLN: 5.0000 mg | Freq: Once | ORAL | Status: AC | PRN

## 2022-11-04 MED ORDER — DEXAMETHASONE SODIUM PHOSPHATE 10 MG/ML IJ SOLN
INTRAMUSCULAR | Status: AC
  Filled 2022-11-04: qty 1

## 2022-11-04 MED ORDER — MIDAZOLAM HCL 2 MG/2ML IJ SOLN
INTRAMUSCULAR | Status: DC | PRN
  Administered 2022-11-04: 2 mg via INTRAVENOUS

## 2022-11-04 MED ORDER — OXYCODONE HCL 5 MG PO TABS
ORAL_TABLET | ORAL | Status: AC
  Administered 2022-11-04: 5 mg via ORAL
  Filled 2022-11-04: qty 1

## 2022-11-04 MED ORDER — BUPIVACAINE HCL (PF) 0.5 % IJ SOLN
INTRAMUSCULAR | Status: AC
  Filled 2022-11-04: qty 30

## 2022-11-04 MED ORDER — HYDROCODONE-ACETAMINOPHEN 5-325 MG PO TABS
1.0000 | ORAL_TABLET | Freq: Four times a day (QID) | ORAL | 0 refills | Status: DC | PRN
  Filled 2022-11-04: qty 30, 8d supply, fill #0

## 2022-11-04 MED ORDER — PROPOFOL 10 MG/ML IV BOLUS
INTRAVENOUS | Status: AC
  Filled 2022-11-04: qty 20

## 2022-11-04 MED ORDER — EPHEDRINE 5 MG/ML INJ
INTRAVENOUS | Status: AC
  Filled 2022-11-04: qty 5

## 2022-11-04 MED ORDER — PROPOFOL 10 MG/ML IV BOLUS
INTRAVENOUS | Status: DC | PRN
  Administered 2022-11-04: 20 mg via INTRAVENOUS
  Administered 2022-11-04: 160 mg via INTRAVENOUS

## 2022-11-04 MED ORDER — EPHEDRINE SULFATE-NACL 50-0.9 MG/10ML-% IV SOSY
PREFILLED_SYRINGE | INTRAVENOUS | Status: DC | PRN
  Administered 2022-11-04: 5 mg via INTRAVENOUS

## 2022-11-04 MED ORDER — ACETAMINOPHEN 500 MG PO TABS
1000.0000 mg | ORAL_TABLET | Freq: Once | ORAL | Status: AC
  Administered 2022-11-04: 1000 mg via ORAL
  Filled 2022-11-04: qty 2

## 2022-11-04 MED ORDER — CHLORHEXIDINE GLUCONATE 0.12 % MT SOLN
15.0000 mL | Freq: Once | OROMUCOSAL | Status: AC
  Administered 2022-11-04: 15 mL via OROMUCOSAL

## 2022-11-04 MED ORDER — FENTANYL CITRATE PF 50 MCG/ML IJ SOSY
25.0000 ug | PREFILLED_SYRINGE | INTRAMUSCULAR | Status: DC | PRN
  Administered 2022-11-04 (×2): 50 ug via INTRAVENOUS

## 2022-11-04 MED ORDER — DEXAMETHASONE SODIUM PHOSPHATE 10 MG/ML IJ SOLN
INTRAMUSCULAR | Status: DC | PRN
  Administered 2022-11-04: 8 mg via INTRAVENOUS

## 2022-11-04 MED ORDER — MEPERIDINE HCL 50 MG/ML IJ SOLN: 6.2500 mg | INTRAMUSCULAR | Status: DC | PRN

## 2022-11-04 MED ORDER — MORPHINE SULFATE (PF) 4 MG/ML IV SOLN
INTRAVENOUS | Status: AC
  Filled 2022-11-04: qty 1

## 2022-11-04 MED ORDER — FENTANYL CITRATE (PF) 100 MCG/2ML IJ SOLN
INTRAMUSCULAR | Status: AC
  Filled 2022-11-04: qty 2

## 2022-11-04 MED ORDER — LIDOCAINE 2% (20 MG/ML) 5 ML SYRINGE
INTRAMUSCULAR | Status: DC | PRN
  Administered 2022-11-04: 100 mg via INTRAVENOUS

## 2022-11-04 MED ORDER — ONDANSETRON HCL 4 MG/2ML IJ SOLN: 4.0000 mg | Freq: Once | INTRAMUSCULAR | Status: DC | PRN

## 2022-11-04 MED ORDER — FENTANYL CITRATE PF 50 MCG/ML IJ SOSY
PREFILLED_SYRINGE | INTRAMUSCULAR | Status: AC
  Filled 2022-11-04: qty 2

## 2022-11-04 MED ORDER — ONDANSETRON HCL 4 MG/2ML IJ SOLN
INTRAMUSCULAR | Status: AC
  Filled 2022-11-04: qty 2

## 2022-11-04 MED ORDER — ATROPINE SULFATE 0.4 MG/ML IV SOLN
INTRAVENOUS | Status: DC | PRN
  Administered 2022-11-04: .4 mg via INTRAVENOUS

## 2022-11-04 SURGICAL SUPPLY — 21 items
BAG COUNTER SPONGE SURGICOUNT (BAG) IMPLANT
BNDG ELASTIC 6X5.8 VLCR STR LF (GAUZE/BANDAGES/DRESSINGS) ×1 IMPLANT
COVER SURGICAL LIGHT HANDLE (MISCELLANEOUS) ×1 IMPLANT
DRAPE U-SHAPE 47X51 STRL (DRAPES) ×1 IMPLANT
DURAPREP 26ML APPLICATOR (WOUND CARE) ×1 IMPLANT
GAUZE 4X4 16PLY ~~LOC~~+RFID DBL (SPONGE) ×1 IMPLANT
GAUZE PAD ABD 8X10 STRL (GAUZE/BANDAGES/DRESSINGS) ×1 IMPLANT
GAUZE SPONGE 4X4 12PLY STRL (GAUZE/BANDAGES/DRESSINGS) ×1 IMPLANT
GAUZE XEROFORM 1X8 LF (GAUZE/BANDAGES/DRESSINGS) ×1 IMPLANT
GLOVE BIO SURGEON STRL SZ7.5 (GLOVE) ×1 IMPLANT
GLOVE BIOGEL PI IND STRL 8 (GLOVE) ×2 IMPLANT
GLOVE ECLIPSE 8.0 STRL XLNG CF (GLOVE) ×1 IMPLANT
GOWN STRL REUS W/ TWL XL LVL3 (GOWN DISPOSABLE) ×2 IMPLANT
GOWN STRL REUS W/TWL XL LVL3 (GOWN DISPOSABLE) ×2
MANIFOLD NEPTUNE II (INSTRUMENTS) ×1 IMPLANT
PACK ARTHROSCOPY WL (CUSTOM PROCEDURE TRAY) ×1 IMPLANT
PADDING CAST COTTON 6X4 STRL (CAST SUPPLIES) ×1 IMPLANT
PROTECTOR NERVE ULNAR (MISCELLANEOUS) ×1 IMPLANT
SUT ETHILON 4 0 PS 2 18 (SUTURE) ×1 IMPLANT
TUBING ARTHROSCOPY IRRIG 16FT (MISCELLANEOUS) ×1 IMPLANT
WRAP KNEE MAXI GEL POST OP (GAUZE/BANDAGES/DRESSINGS) ×1 IMPLANT

## 2022-11-04 NOTE — Discharge Instructions (Signed)
Expect left knee pain and swelling -ice and elevation as needed. Do take a 325 mg aspirin daily for 6 days. Pump your feet and ankles often during the day. You can increase her activities and put full weight on your left lower extremity as comfort allows. You can remove all of your dressing in 24 to 48 hours and get your knee incisions wet daily in the shower. Do place Band-Aids over your incisions daily after each shower.

## 2022-11-04 NOTE — Anesthesia Procedure Notes (Signed)
Procedure Name: LMA Insertion Date/Time: 11/04/2022 11:09 AM  Performed by: Eben Burow, CRNAPre-anesthesia Checklist: Patient identified, Emergency Drugs available, Suction available, Patient being monitored and Timeout performed Patient Re-evaluated:Patient Re-evaluated prior to induction Oxygen Delivery Method: Circle system utilized Preoxygenation: Pre-oxygenation with 100% oxygen Induction Type: IV induction Ventilation: Mask ventilation without difficulty LMA: LMA inserted LMA Size: 4.0 Number of attempts: 1 Tube secured with: Tape Dental Injury: Teeth and Oropharynx as per pre-operative assessment

## 2022-11-04 NOTE — H&P (Signed)
Jennifer Casey is an 56 y.o. female.   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  After trial of conservative treatment a MRI was obtained of the left knee showing a complex medial meniscal tear.  She still tried conservative treatment appropriately including strengthening her knee, activity modification, anti-inflammatories and steroid injections.  With the continued mechanical symptoms, clinical exam findings and MRI findings we are recommending an arthroscopic intervention at this point for left knee and she agrees to this as well.  Past Medical History:  Diagnosis Date   Arthritis    Family history of adverse reaction to anesthesia    History of kidney stones     Past Surgical History:  Procedure Laterality Date   COLONOSCOPY     DILATION AND CURETTAGE OF UTERUS      Family History  Problem Relation Age of Onset   Osteoarthritis Mother    Diabetes Father    Heart disease Father    Kidney disease Father    Hyperlipidemia Father    Social History:  reports that she has never smoked. She has never used smokeless tobacco. She reports current alcohol use of about 1.0 standard drink of alcohol per week. She reports that she does not use drugs.  Allergies: No Known Allergies  Medications Prior to Admission  Medication Sig Dispense Refill   ibuprofen (ADVIL) 200 MG tablet Take 200 mg by mouth every 8 (eight) hours as needed for moderate pain.     loratadine (CLARITIN) 10 MG tablet Take 10 mg by mouth daily as needed for allergies.     mometasone (NASONEX) 50 MCG/ACT nasal spray Place 2 sprays into the nose daily as needed (allergies).     clobetasol ointment (TEMOVATE) 9.67 % Apply 1 Application topically daily as needed (lichen sclerosus).     oxyCODONE-acetaminophen (PERCOCET/ROXICET) 5-325 MG tablet Take 1-2  tablets by mouth every 6 (six) hours as needed for severe pain. (Patient not taking: Reported on 10/25/2022) 15 tablet 0    No results found for this or any previous visit (from the past 48 hour(s)). No results found.  Review of Systems  Musculoskeletal:  Positive for joint swelling.  All other systems reviewed and are negative.   Blood pressure (!) 149/87, pulse 74, temperature 98.5 F (36.9 C), temperature source Oral, resp. rate 16, last menstrual period 03/25/2015, SpO2 97 %. Physical Exam Vitals reviewed.  Constitutional:      Appearance: Normal appearance. She is normal weight.  HENT:     Head: Normocephalic and atraumatic.  Eyes:     Extraocular Movements: Extraocular movements intact.     Pupils: Pupils are equal, round, and reactive to light.  Cardiovascular:     Rate and Rhythm: Normal rate and regular rhythm.     Pulses: Normal pulses.  Pulmonary:     Effort: Pulmonary effort is normal.     Breath sounds: Normal breath sounds.  Abdominal:     Palpations: Abdomen is soft.  Musculoskeletal:     Cervical back: Normal range of motion and neck supple.     Left knee: Tenderness present over the medial joint line. Abnormal meniscus.  Neurological:     Mental Status: She is alert and oriented to person, place, and time.  Psychiatric:        Behavior: Behavior normal.  Assessment/Plan Left knee medial meniscal tear  The plan is to proceed to surgery today as an outpatient for a left knee arthroscopy with partial medial meniscectomy.  The risks and benefits of surgery been described in detail and informed consent is obtained.  The left operative knee has been marked.  Mcarthur Rossetti, MD 11/04/2022, 10:23 AM

## 2022-11-04 NOTE — Op Note (Signed)
Operative Note  Date of operation: 11/04/2022 Preoperative diagnosis: Left knee acute and complex medial meniscal tear Postoperative diagnosis: Same  Procedure: Left knee arthroscopy with partial medial meniscectomy Findings: Posterior horn and root complex medial meniscal tear, grade 3 to grade IV chondromalacia over the medial femoral condyle  Surgeon: Lind Guest. 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  She had medial joint line tenderness with locking and catching.  She did have a popping sensation when the injury happened in her left knee.  An MRI showed a complex medial meniscal tear with a tear of the posterior horn of the meniscal root.  It did indicate there is moderate thinning of the cartilage in her knee as well.  She had not had any knee issues before this injury.  She did try conservative treatment including activity modification, anti-inflammatories, quad strengthening exercises with physical therapy and a steroid injection.  After continued mechanical symptoms we recommended arthroscopic intervention.  The risk and benefits of the surgery been explained in detail and informed consent has been obtained.  Procedure description: After informed consent was obtained appropriate right knee was marked, the patient is brought to the operating room and placed supine on the operating table where general anesthesia was obtained.  The lateral leg post was utilized in her thigh, knee, leg and ankle were prepped and draped with DuraPrep and sterile drapes include a sterile stockinette.  The left operative knee was then flexed off the side table and a timeout was called and she is advised correct patient correct left knee.  We then made an anterior lateral arthroscopy portal  inserted cane in the knee and drained a large effusion from the knee.  We placed the camera in the knee went to the medial compartment of the anterior medial incision.  We unfortunately did find an area of significant cartilage wear on the medial femoral condyle.  We used arthroscopic shaver to perform a partial chondroplasty in this area.  We did bring her back to stable margin but there is still a significant area of cartilage wear.  We did find a significant meniscal tear as well as a posterior horn to mid body.  Using arthroscopic shaver and arthroscopic basket forceps/biters were performed a partial medial meniscectomy.  We then assessed the ACL and found it to be intact as well as the PCL.  Lateral compartment was normal with a normal meniscus and normal cartilage.  The patellofemoral joint had where the cartilage itself but nothing severe.  There is also inflamed Hoffa's fat pad.  We debrided all of these areas.  We then allowed fluid lavage to the knee and drained fluid off from the knee.  The portal sites were closed interrupted nylon suture.  Well-padded dressing was applied and the patient was awakened, extubated and taken recovery room in stable addition with all final counts being correct and no complications noted.  Postoperatively will allow her to weight-bear as tolerated with increasing her activities.  Will see her back in the office in a week.

## 2022-11-04 NOTE — Transfer of Care (Signed)
Immediate Anesthesia Transfer of Care Note  Patient: Jennifer Casey  Procedure(s) Performed: LEFT KNEE ARTHROSCOPY WITH PARTIAL MEDIAL MENISCECTOMY (Left: Knee)  Patient Location: PACU  Anesthesia Type:General  Level of Consciousness: awake, alert , and patient cooperative  Airway & Oxygen Therapy: Patient Spontanous Breathing and Patient connected to face mask oxygen  Post-op Assessment: Report given to RN and Post -op Vital signs reviewed and stable  Post vital signs: Reviewed and stable  Last Vitals:  Vitals Value Taken Time  BP 151/86 11/04/22 1157  Temp 36.3 C 11/04/22 1157  Pulse 87 11/04/22 1158  Resp 10 11/04/22 1158  SpO2 100 % 11/04/22 1158  Vitals shown include unvalidated device data.  Last Pain:  Vitals:   11/04/22 0914  TempSrc: Oral  PainSc: 0-No pain         Complications: No notable events documented.

## 2022-11-04 NOTE — Anesthesia Preprocedure Evaluation (Signed)
Anesthesia Evaluation  Patient identified by MRN, date of birth, ID band Patient awake    Reviewed: Allergy & Precautions, H&P , NPO status , Patient's Chart, lab work & pertinent test results  History of Anesthesia Complications (+) Family history of anesthesia reaction  Airway Mallampati: II  TM Distance: >3 FB Neck ROM: Full    Dental no notable dental hx.    Pulmonary neg pulmonary ROS   Pulmonary exam normal breath sounds clear to auscultation       Cardiovascular negative cardio ROS Normal cardiovascular exam Rhythm:Regular Rate:Normal     Neuro/Psych  Headaches  negative psych ROS   GI/Hepatic negative GI ROS, Neg liver ROS,,,  Endo/Other  negative endocrine ROS    Renal/GU Renal diseasenegative Renal ROS  negative genitourinary   Musculoskeletal negative musculoskeletal ROS (+) Arthritis ,    Abdominal   Peds negative pediatric ROS (+)  Hematology negative hematology ROS (+)   Anesthesia Other Findings   Reproductive/Obstetrics negative OB ROS                             Anesthesia Physical Anesthesia Plan  ASA: 2  Anesthesia Plan: General   Post-op Pain Management: Tylenol PO (pre-op)* and Celebrex PO (pre-op)*   Induction: Intravenous  PONV Risk Score and Plan: 3 and Ondansetron, Dexamethasone and Treatment may vary due to age or medical condition  Airway Management Planned: LMA  Additional Equipment: None  Intra-op Plan:   Post-operative Plan: Extubation in OR  Informed Consent:   Plan Discussed with: CRNA, Surgeon and Anesthesiologist  Anesthesia Plan Comments: ( )       Anesthesia Quick Evaluation

## 2022-11-04 NOTE — Anesthesia Postprocedure Evaluation (Signed)
Anesthesia Post Note  Patient: Jennifer Casey  Procedure(s) Performed: LEFT KNEE ARTHROSCOPY WITH PARTIAL MEDIAL MENISCECTOMY (Left: Knee)     Patient location during evaluation: PACU Anesthesia Type: General Level of consciousness: awake and alert Pain management: pain level controlled Vital Signs Assessment: post-procedure vital signs reviewed and stable Respiratory status: spontaneous breathing, nonlabored ventilation, respiratory function stable and patient connected to nasal cannula oxygen Cardiovascular status: blood pressure returned to baseline and stable Postop Assessment: no apparent nausea or vomiting Anesthetic complications: no  No notable events documented.  Last Vitals:  Vitals:   11/04/22 1230 11/04/22 1245  BP: (!) 147/82 133/66  Pulse: 63 61  Resp: (!) 7 (!) 9  Temp:    SpO2: 98% 100%    Last Pain:  Vitals:   11/04/22 1245  TempSrc:   PainSc: Asleep                 Kato Wieczorek

## 2022-11-05 ENCOUNTER — Encounter (HOSPITAL_COMMUNITY): Payer: Self-pay | Admitting: Orthopaedic Surgery

## 2022-11-08 DIAGNOSIS — F4320 Adjustment disorder, unspecified: Secondary | ICD-10-CM | POA: Diagnosis not present

## 2022-11-11 ENCOUNTER — Ambulatory Visit (HOSPITAL_COMMUNITY)
Admission: RE | Admit: 2022-11-11 | Discharge: 2022-11-11 | Disposition: A | Discharge status: Home / Self Care | Payer: 59 | Source: Ambulatory Visit | Attending: Physician Assistant | Admitting: Physician Assistant

## 2022-11-11 ENCOUNTER — Ambulatory Visit (INDEPENDENT_AMBULATORY_CARE_PROVIDER_SITE_OTHER): Payer: 59 | Admitting: Physician Assistant

## 2022-11-11 ENCOUNTER — Telehealth: Payer: Self-pay | Admitting: Orthopaedic Surgery

## 2022-11-11 ENCOUNTER — Encounter: Payer: Self-pay | Admitting: Physician Assistant

## 2022-11-11 DIAGNOSIS — Z9889 Other specified postprocedural states: Secondary | ICD-10-CM

## 2022-11-11 DIAGNOSIS — I82409 Acute embolism and thrombosis of unspecified deep veins of unspecified lower extremity: Secondary | ICD-10-CM

## 2022-11-11 HISTORY — DX: Acute embolism and thrombosis of unspecified deep veins of unspecified lower extremity: I82.409

## 2022-11-11 NOTE — Progress Notes (Signed)
HPI: Mrs. Poliquin comes in today status post left knee arthroscopy 11/04/2022.  She underwent partial medial meniscectomy.  She is found to have grade 3 to grade IV chondromalacia involving the medial femoral condyle.  Lateral compartment was pristine.  Mild patellofemoral changes.  She is overall doing well.  She is using a cane to ambulate.  No shortness of breath fevers chills.  She has had some calf pain though.  Notes some swelling of the left leg.  She is taking Motrin for pain.  Review of systems: See HPI otherwise negative  Physical exam: General well-developed well-nourished female no acute distress mood affect appropriate. Left knee port sites approximated with nylon suture.  Most medial port with area of ecchymosis.  There is no drainage.  Left calf supple but tender.  Dorsiflexion plantarflexion left ankle intact.  Full extension left knee in flexion to at least 90 degrees.   Impression: Status post left knee arthroscopy with partial medial meniscectomy Left knee osteoarthritis  Plan: She will continue work on range of motion strengthening the knee.  Continue to use compression stocking until swelling resolves.  Due to the fact she has had some calf pain and is tender with palpation of the calf today recommend Doppler rule out DVT she is agreeable.  Sutures were removed today she will work on scar tissue mobilization.  Questions were encouraged and answered at length arthroscopy images were reviewed with the patient.  She will follow-up with [place] in 4 weeks sooner if there is any questions concerns.  Doppler of the left lower extremity was negative for DVT on the preliminary report.  Patient was called and told to wear compression stockings and slowly increase her activities as tolerated.

## 2022-11-11 NOTE — Telephone Encounter (Signed)
Patient need a PT appt . Please call to schedule..[phone]

## 2022-11-11 NOTE — Progress Notes (Signed)
[id number] 

## 2022-11-12 ENCOUNTER — Other Ambulatory Visit: Payer: Self-pay

## 2022-11-12 ENCOUNTER — Ambulatory Visit (INDEPENDENT_AMBULATORY_CARE_PROVIDER_SITE_OTHER): Payer: 59 | Admitting: Physical Therapy

## 2022-11-12 ENCOUNTER — Encounter: Payer: Self-pay | Admitting: Physical Therapy

## 2022-11-12 DIAGNOSIS — R600 Localized edema: Secondary | ICD-10-CM

## 2022-11-12 DIAGNOSIS — M6281 Muscle weakness (generalized): Secondary | ICD-10-CM | POA: Diagnosis not present

## 2022-11-12 DIAGNOSIS — R262 Difficulty in walking, not elsewhere classified: Secondary | ICD-10-CM

## 2022-11-12 DIAGNOSIS — M25562 Pain in left knee: Secondary | ICD-10-CM

## 2022-11-12 NOTE — Therapy (Signed)
OUTPATIENT PHYSICAL THERAPY LOWER EXTREMITY EVALUATION   Patient Name: Jennifer Casey MRN: [id number] DOB:Aug 29, 1966, 56 y.o., female Today's Date: 11/12/2022   PT End of Session - 11/12/22 1106     Visit Number 1    Number of Visits 4    Date for PT Re-Evaluation 12/10/22    PT Start Time 2951    PT Stop Time 1140    PT Time Calculation (min) 37 min    Activity Tolerance Patient tolerated treatment well;No increased pain    Behavior During Therapy WFL for tasks assessed/performed              Past Medical History:  Diagnosis Date   Arthritis    Family history of adverse reaction to anesthesia    History of kidney stones    Past Surgical History:  Procedure Laterality Date   COLONOSCOPY     DILATION AND CURETTAGE OF UTERUS     KNEE ARTHROSCOPY WITH MEDIAL MENISECTOMY Left 11/04/2022   Procedure: LEFT KNEE ARTHROSCOPY WITH PARTIAL MEDIAL MENISCECTOMY;  Surgeon: Mcarthur Rossetti, MD;  Location: [facility];  Service: Orthopedics;  Laterality: Left;   Patient Active Problem List   Diagnosis Date Noted   Complex tear of medial meniscus of left knee as current injury 11/04/2022   Acute medial meniscal injury of left knee 11/04/2022   Hyperglycemia 07/07/2022   Hyperlipidemia 04/29/2022   Seasonal allergies 04/29/2022   Multiple lipomas 04/29/2022   History of migraine headaches 04/29/2022   Nephrolithiasis 04/29/2022   Fatty liver 04/28/2022   Vitamin D deficiency 04/28/2022   Pain in joint of right shoulder 03/19/2022   Family history of neoplasm of ovary 88/41/6606   Lichen sclerosus et atrophicus 10/25/2016   Abnormality of gait 10/08/2015   Knee pain, bilateral 05/08/2009    PCP: Binnie Rail, MD  REFERRING PROVIDER: Pete Pelt, PA-C  REFERRING DIAG: [phone] (ICD-10-CM) - S/P left knee arthroscopy  THERAPY DIAG:  Difficulty in walking, not elsewhere classified - Plan: PT plan of care cert/re-cert  Muscle weakness (generalized) - Plan: PT  plan of care cert/re-cert  Left knee pain, unspecified chronicity - Plan: PT plan of care cert/re-cert  Localized edema - Plan: PT plan of care cert/re-cert  Rationale for Evaluation and Treatment: Rehabilitation  ONSET DATE: 11/04/22  SUBJECTIVE:   SUBJECTIVE STATEMENT: Pt is s/p Lt knee scope with meniscus debridement on 11/04/22.  She has has some medial OA that was discovered during surgery.  She is amb without AD at this time, and occasional cane outside of home.  PERTINENT HISTORY: Hyperglycemia, HLD, history of migraines, R shoulder and Bil knee pain  PAIN:  Are you having pain? Yes: NPRS scale: 3-4/10 Pain location: L medial knee Pain description: Has been sharp, ache Aggravating factors: Stairs, worse at night Relieving factors: motrin  PRECAUTIONS: None  WEIGHT BEARING RESTRICTIONS: No  FALLS:  Has patient fallen in last 6 months? Yes. 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 Radial tear of the posterior horn of the medial meniscus towards the meniscal root. 2. Full-thickness cartilage loss of the medial patellofemoral compartment. 3. High-grade  partial-thickness cartilage loss of the medial femoral condyle. Partial-thickness cartilage loss the medial tibial plateau.  X-rays show some mild  patellofemoral arthritis.  There is only mild OA without significant joint  narrowing of either compartment.  There is a spur off the posterior aspect  of the proximal tibia on lateral film.  No acute fracture noted.  PATIENT SURVEYS:  11/12/22: FOTO deferred today  COGNITION: Overall  cognitive status: Within functional limits for tasks assessed     EDEMA: Swelling noted in Lt knee   LOWER EXTREMITY ROM:  Active ROM Right 09/29/2022 Left 09/29/2022  Knee flexion 138 115  Knee extension 0 0-2 (hyper-extension)   (Blank rows = not tested)  Passive ROM Right 09/29/2022 Left 09/29/2022  Knee flexion  120  Knee extension     (Blank rows = not tested)  LOWER EXTREMITY STRENGTH:  Deferred due to post op status  STRENGTH Right 09/29/2022 Left 09/29/2022  Hip flexion    Hip extension    Hip abduction    Hip adduction    Hip internal rotation    Hip external rotation    Knee flexion    Knee extension    Ankle dorsiflexion    Ankle plantarflexion    Ankle inversion    Ankle eversion     (Blank rows = not tested)  GAIT: 11/12/22  Distance walked: In the clinic Assistive device utilized:  None Level of assistance: Complete Independence Comments: mild antalgic gait on Lt   TODAY'S TREATMENT:                                                                                                                              DATE:  11/12/2022 See HEP-  performed trial reps PRN for comprehension, pt with good understanding of initial exercises  Vaso x 10 min to Lt knee, mod pressure, 34 deg   PATIENT EDUCATION:  Education details:HEP Person educated: Patient Education method: Explanation, Demonstration, Tactile cues, Verbal cues, and Handouts Education comprehension: verbalized understanding, returned demonstration, verbal cues required, tactile cues required, and needs further education  HOME EXERCISE PROGRAM: Access Code: [facility] URL: [email or web address] Date: 11/12/2022 Prepared by: Faustino Congress  Exercises - Supine Quadricep Sets  - 2 x daily - 7 x weekly - 2-3 sets - 10 reps - 5 second hold - Small Range Straight Leg Raise  - 2 x daily - 7 x weekly - 5 sets - 5 reps - 3 seconds hold - Long Sitting Knee Flexion Hold  - 2-3 x daily  - 7 x weekly - 1 sets - 5-10 reps - 5-10 minutes hold  ASSESSMENT:  CLINICAL IMPRESSION: Patient is a 56 y.o. female who was seen today for physical therapy evaluation and treatment s/p Lt knee scope with meniscus debridement on 11/04/22.  She demonstrates decreased strength and ROM with gait abnormalities and expected post op pain and swelling affecting functional mobility.  She will benefit from PT to address deficits  listed.  OBJECTIVE IMPAIRMENTS: Abnormal gait, decreased activity tolerance, decreased endurance, decreased knowledge of condition, difficulty walking, decreased strength, impaired perceived functional ability, obesity, and pain.   ACTIVITY LIMITATIONS: standing, squatting, stairs, and cycling  PARTICIPATION LIMITATIONS: community activity and workouts  PERSONAL FACTORS: Hyperglycemia, HLD, history of migraines, R shoulder and Bil knee pain are also affecting patient's functional outcome.   REHAB POTENTIAL: Good  CLINICAL DECISION MAKING: Stable/uncomplicated  EVALUATION COMPLEXITY: Low   GOALS: Goals reviewed with patient? Yes  LONG TERM GOALS: Target date: 12/10/2022  Independent with final HEP Goal status: INITIAL  2.  Lt knee AROM improved to 140 deg flexion for improved mobility Goal status: INIITAL  3.  Report pain < 2/10 with standing and walking activities for improved function Goal status: INITIAL  4.  Amb without significant deviations for improved functional mobility. Goal status: INITIAL    PLAN:  PT FREQUENCY: 1x/week  PT DURATION: 4 weeks  PLANNED INTERVENTIONS: Therapeutic exercises, Therapeutic activity, Neuromuscular re-education, Balance training, Gait training, Patient/Family education, Self Care, Joint mobilization, Stair training, Aquatic Therapy, Dry Needling, Electrical stimulation, Cryotherapy, Moist heat, Taping, Vasopneumatic device, Manual therapy, and Re-evaluation  PLAN FOR NEXT SESSION:  Review HEP, gym/home strength  progressions and knee flexion   Laureen Abrahams, PT, DPT 11/12/22 11:48 AM

## 2022-11-23 ENCOUNTER — Encounter: Payer: Self-pay | Admitting: Rehabilitative and Restorative Service Providers"

## 2022-11-23 ENCOUNTER — Ambulatory Visit (INDEPENDENT_AMBULATORY_CARE_PROVIDER_SITE_OTHER): Payer: Commercial Managed Care - PPO | Admitting: Rehabilitative and Restorative Service Providers"

## 2022-11-23 DIAGNOSIS — R262 Difficulty in walking, not elsewhere classified: Secondary | ICD-10-CM

## 2022-11-23 DIAGNOSIS — M25562 Pain in left knee: Secondary | ICD-10-CM | POA: Diagnosis not present

## 2022-11-23 DIAGNOSIS — R600 Localized edema: Secondary | ICD-10-CM

## 2022-11-23 DIAGNOSIS — M6281 Muscle weakness (generalized): Secondary | ICD-10-CM

## 2022-11-23 NOTE — Therapy (Signed)
OUTPATIENT PHYSICAL THERAPY TREATMENT NOTE   Patient Name: Jennifer Casey MRN: [id number] DOB:1966/07/23, 57 y.o., female Today's Date: 11/23/2022  END OF SESSION:   PT End of Session - 11/23/22 1135     Visit Number 2    Number of Visits 4    Date for PT Re-Evaluation 12/10/22    PT Start Time 0846    PT Stop Time 0936    PT Time Calculation (min) 50 min    Activity Tolerance Patient tolerated treatment well;No increased pain    Behavior During Therapy WFL for tasks assessed/performed             Past Medical History:  Diagnosis Date   Arthritis    Family history of adverse reaction to anesthesia    History of kidney stones    Past Surgical History:  Procedure Laterality Date   COLONOSCOPY     DILATION AND CURETTAGE OF UTERUS     KNEE ARTHROSCOPY WITH MEDIAL MENISECTOMY Left 11/04/2022   Procedure: LEFT KNEE ARTHROSCOPY WITH PARTIAL MEDIAL MENISCECTOMY;  Surgeon: Mcarthur Rossetti, MD;  Location: [facility];  Service: Orthopedics;  Laterality: Left;   Patient Active Problem List   Diagnosis Date Noted   Complex tear of medial meniscus of left knee as current injury 11/04/2022   Acute medial meniscal injury of left knee 11/04/2022   Hyperglycemia 07/07/2022   Hyperlipidemia 04/29/2022   Seasonal allergies 04/29/2022   Multiple lipomas 04/29/2022   History of migraine headaches 04/29/2022   Nephrolithiasis 04/29/2022   Fatty liver 04/28/2022   Vitamin D deficiency 04/28/2022   Pain in joint of right shoulder 03/19/2022   Family history of neoplasm of ovary 41/66/0630   Lichen sclerosus et atrophicus 10/25/2016   Abnormality of gait 10/08/2015   Knee pain, bilateral 05/08/2009     THERAPY DIAG:  Difficulty in walking, not elsewhere classified  Muscle weakness (generalized)  Left knee pain, unspecified chronicity  Localized edema  PCP: Binnie Rail, MD   REFERRING PROVIDER: Pete Pelt, PA-C   REFERRING DIAG: [phone] (ICD-10-CM) - S/P left  knee arthroscopy   THERAPY DIAG:  Difficulty in walking, not elsewhere classified - Plan: PT plan of care cert/re-cert   Muscle weakness (generalized) - Plan: PT plan of care cert/re-cert   Left knee pain, unspecified chronicity - Plan: PT plan of care cert/re-cert   Localized edema - Plan: PT plan of care cert/re-cert   Rationale for Evaluation and Treatment: Rehabilitation   ONSET DATE: 11/04/22   SUBJECTIVE:    SUBJECTIVE STATEMENT: [name] was able to do limited stairs this weekend without pain.   PERTINENT HISTORY: Hyperglycemia, HLD, history of migraines, R shoulder and Bil knee pain   PAIN:  Are you having pain? Yes: NPRS scale: 1-4/10 over the past 4 days Pain location: L medial and anterior knee Pain description: Occasional sharp pain at night, usually aches Aggravating factors: Stairs, worse at night Relieving factors: Motrin, ice   PRECAUTIONS: None   WEIGHT BEARING RESTRICTIONS: No   FALLS:  Has patient fallen in last 6 months? Yes. 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 Radial tear of the posterior horn of the medial meniscus towards the meniscal root. 2. Full-thickness cartilage loss of the medial patellofemoral compartment. 3. High-grade partial-thickness cartilage loss of the medial femoral condyle. Partial-thickness cartilage loss the medial tibial plateau.   X-rays show some mild  patellofemoral arthritis.  There is only mild OA without significant joint  narrowing of either compartment.  There is a spur off the posterior aspect  of the proximal tibia on lateral  film.  No acute fracture noted.   PATIENT SURVEYS:  11/12/22: FOTO deferred today   COGNITION: Overall cognitive status: Within functional limits for tasks assessed                          EDEMA: Swelling noted in Lt knee     LOWER EXTREMITY ROM:   Active ROM Right 09/29/2022 Left 09/29/2022  Knee flexion 138 115  Knee extension 0 0-2 (hyper-extension)   (Blank rows = not tested)   Passive ROM Right 09/29/2022 Left 09/29/2022  Knee flexion   120  Knee extension       (Blank rows = not tested)   LOWER EXTREMITY STRENGTH:  Deferred due to post op status   STRENGTH Right 09/29/2022 Left 09/29/2022  Hip flexion      Hip extension      Hip abduction      Hip adduction      Hip internal rotation      Hip external rotation      Knee flexion      Knee extension      Ankle dorsiflexion      Ankle plantarflexion      Ankle inversion      Ankle eversion       (Blank rows = not tested)   GAIT: 11/12/22  Distance walked: In the clinic Assistive device utilized:  None Level of assistance: Complete Independence Comments: mild antalgic gait on Lt     TODAY'S TREATMENT: DATE:  11/23/2022 Sci Fit Bike Seat 6 for 5 minutes Level 5 Quadriceps sets 2 sets of 10 for 5 seconds Seated straight leg raises 3 sets of 5 with 3# Seated knee flexion 1 minute (per HEP)  Functional Activities: Leg Press Double Leg 75# and 100# 10X each slow eccentrics; Single Leg 37# and 50# 10X each slow eccentrics  Balance: Tandem 3X 20 seconds each eyes open; head moving; eyes closed  Vasopneumatic Lt knee Medium Pressure 10 minutes 34*   11/12/2022 See HEP-  performed trial reps PRN for comprehension, pt with good understanding of initial exercises   Vaso x 10 min to Lt knee, mod pressure, 34 deg     PATIENT EDUCATION:  Education details:HEP Person educated: Patient Education method: Explanation, Demonstration, Tactile cues, Verbal cues, and Handouts Education comprehension: verbalized  understanding, returned demonstration, verbal cues required, tactile cues required, and needs further education   HOME EXERCISE PROGRAM: Access Code: [facility] URL: [email or web address] Date: 11/12/2022 Prepared by: Faustino Congress   Exercises - Supine Quadricep Sets  - 2 x daily - 7 x weekly - 2-3 sets - 10 reps - 5 second hold - Small Range Straight Leg Raise  - 2 x daily - 7 x weekly - 5 sets - 5 reps - 3 seconds hold - Long Sitting Knee Flexion Hold  - 2-3 x daily - 7 x weekly - 1 sets - 5-10 reps - 5-10 minutes hold   ASSESSMENT:   CLINICAL  IMPRESSION: [name] is doing a great job with her early home exercises.  Her active range of motion was much better today and the focus will remain on quadriceps strengthening and edema control.  She is headed out of town for a conference and we will follow-up with her upon her return.  She appears to be on track to meet all long-term goals.   OBJECTIVE IMPAIRMENTS: Abnormal gait, decreased activity tolerance, decreased endurance, decreased knowledge of condition, difficulty walking, decreased strength, impaired perceived functional ability, obesity, and pain.    ACTIVITY LIMITATIONS: standing, squatting, stairs, and cycling   PARTICIPATION LIMITATIONS: community activity and workouts   PERSONAL FACTORS: Hyperglycemia, HLD, history of migraines, R shoulder and Bil knee pain are also affecting patient's functional outcome.    REHAB POTENTIAL: Good   CLINICAL DECISION MAKING: Stable/uncomplicated   EVALUATION COMPLEXITY: Low     GOALS: Goals reviewed with patient? Yes   LONG TERM GOALS: Target date: 12/10/2022   Independent with final HEP Goal status: On Going 11/23/2022   2.  Lt knee AROM improved to 140 deg flexion for improved mobility Goal status: On Going 11/23/2022   3.  Report pain < 2/10 with standing and walking activities for improved function Goal status: On Going 11/23/2022   4.  Amb without significant  deviations for improved functional mobility. Goal status: On Going 11/23/2022       PLAN:   PT FREQUENCY: 1x/week   PT DURATION: 4 weeks   PLANNED INTERVENTIONS: Therapeutic exercises, Therapeutic activity, Neuromuscular re-education, Balance training, Gait training, Patient/Family education, Self Care, Joint mobilization, Stair training, Aquatic Therapy, Dry Needling, Electrical stimulation, Cryotherapy, Moist heat, Taping, Vasopneumatic device, Manual therapy, and Re-evaluation   PLAN FOR NEXT SESSION:  Review HEP, gym/home strength progressions for quadriceps strength and progress WB as appropriate.    Farley Ly, PT, MPT 11/23/2022, 11:40 AM

## 2022-12-01 ENCOUNTER — Encounter: Payer: Self-pay | Admitting: Rehabilitative and Restorative Service Providers"

## 2022-12-01 ENCOUNTER — Ambulatory Visit: Payer: Commercial Managed Care - PPO | Admitting: Rehabilitative and Restorative Service Providers"

## 2022-12-01 DIAGNOSIS — M25562 Pain in left knee: Secondary | ICD-10-CM

## 2022-12-01 DIAGNOSIS — M6281 Muscle weakness (generalized): Secondary | ICD-10-CM

## 2022-12-01 DIAGNOSIS — R600 Localized edema: Secondary | ICD-10-CM

## 2022-12-01 DIAGNOSIS — R262 Difficulty in walking, not elsewhere classified: Secondary | ICD-10-CM | POA: Diagnosis not present

## 2022-12-01 NOTE — Therapy (Signed)
OUTPATIENT PHYSICAL THERAPY TREATMENT NOTE   Patient Name: Jennifer Casey MRN: [id number] DOB:1966-03-06, 57 y.o., female Today's Date: 12/01/2022  END OF SESSION:   PT End of Session - 12/01/22 1609     Visit Number 3    Number of Visits 4    Date for PT Re-Evaluation 12/10/22    PT Start Time 1540    PT Stop Time 0867    PT Time Calculation (min) 38 min    Activity Tolerance Patient tolerated treatment well;No increased pain    Behavior During Therapy WFL for tasks assessed/performed            Past Medical History:  Diagnosis Date   Arthritis    Family history of adverse reaction to anesthesia    History of kidney stones    Past Surgical History:  Procedure Laterality Date   COLONOSCOPY     DILATION AND CURETTAGE OF UTERUS     KNEE ARTHROSCOPY WITH MEDIAL MENISECTOMY Left 11/04/2022   Procedure: LEFT KNEE ARTHROSCOPY WITH PARTIAL MEDIAL MENISCECTOMY;  Surgeon: Mcarthur Rossetti, MD;  Location: [facility];  Service: Orthopedics;  Laterality: Left;   Patient Active Problem List   Diagnosis Date Noted   Complex tear of medial meniscus of left knee as current injury 11/04/2022   Acute medial meniscal injury of left knee 11/04/2022   Hyperglycemia 07/07/2022   Hyperlipidemia 04/29/2022   Seasonal allergies 04/29/2022   Multiple lipomas 04/29/2022   History of migraine headaches 04/29/2022   Nephrolithiasis 04/29/2022   Fatty liver 04/28/2022   Vitamin D deficiency 04/28/2022   Pain in joint of right shoulder 03/19/2022   Family history of neoplasm of ovary 61/95/0932   Lichen sclerosus et atrophicus 10/25/2016   Abnormality of gait 10/08/2015   Knee pain, bilateral 05/08/2009     THERAPY DIAG:  Difficulty in walking, not elsewhere classified  Muscle weakness (generalized)  Left knee pain, unspecified chronicity  Localized edema  PCP: Binnie Rail, MD   REFERRING PROVIDER: Pete Pelt, PA-C   REFERRING DIAG: [phone] (ICD-10-CM) - S/P left  knee arthroscopy   THERAPY DIAG:  Difficulty in walking, not elsewhere classified - Plan: PT plan of care cert/re-cert   Muscle weakness (generalized) - Plan: PT plan of care cert/re-cert   Left knee pain, unspecified chronicity - Plan: PT plan of care cert/re-cert   Localized edema - Plan: PT plan of care cert/re-cert   Rationale for Evaluation and Treatment: Rehabilitation   ONSET DATE: 11/04/22   SUBJECTIVE:    SUBJECTIVE STATEMENT: [name] reports poor HEP compliance while out of town at a conference.  Pain at night can wake her up.  She is walking more without a limp.     PERTINENT HISTORY: Hyperglycemia, HLD, history of migraines, R shoulder and Bil knee pain   PAIN:  Are you having pain? Yes: NPRS scale: 1-5/10 over the past 7 days Pain location: L medial and anterior knee Pain description: Occasional sharp pain at night, usually aches Aggravating factors: Stairs, worse at night Relieving factors: Motrin, ice   PRECAUTIONS: None   WEIGHT BEARING RESTRICTIONS: No   FALLS:  Has patient fallen in last 6 months? Yes. 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 Radial tear of the posterior horn of the medial meniscus towards the meniscal root. 2. Full-thickness cartilage loss of the medial patellofemoral compartment. 3. High-grade partial-thickness cartilage loss of the medial femoral condyle. Partial-thickness cartilage loss the medial tibial plateau.   X-rays show some mild  patellofemoral arthritis.  There is only mild OA without significant joint  narrowing of either compartment.   There is a spur off the posterior aspect  of the proximal tibia on lateral film.  No acute fracture noted.   PATIENT SURVEYS:  11/12/22: FOTO deferred today   COGNITION: Overall cognitive status: Within functional limits for tasks assessed                          EDEMA: Swelling noted in Lt knee     LOWER EXTREMITY ROM:   Active ROM Right 09/29/2022 Left 09/29/2022  Knee flexion 138 115  Knee extension 0 0-2 (hyper-extension)   (Blank rows = not tested)   Passive ROM Right 09/29/2022 Left 09/29/2022  Knee flexion   120  Knee extension       (Blank rows = not tested)   LOWER EXTREMITY STRENGTH:  Deferred due to post op status   STRENGTH Right 09/29/2022 Left 09/29/2022  Hip flexion      Hip extension      Hip abduction      Hip adduction      Hip internal rotation      Hip external rotation      Knee flexion      Knee extension      Ankle dorsiflexion      Ankle plantarflexion      Ankle inversion      Ankle eversion       (Blank rows = not tested)   GAIT: 11/12/22  Distance walked: In the clinic Assistive device utilized:  None Level of assistance: Complete Independence Comments: mild antalgic gait on Lt     TODAY'S TREATMENT: DATE:  12/01/2022 Recumbent bike Seat 2 for 8 minutes Level 2 Seated straight leg raises 5 sets of 5 with 4#  Functional Activities: Leg Press Double Leg 100# and 125# 10X each slow eccentrics; Single Leg 50# and 62# 10X each slow eccentrics Step-up and over 4 inch step and step-down off 2 inch step 2 sets of 10  Balance: Tandem 2X 20 seconds each eyes open & head moving; eyes closed  Vasopneumatic Lt knee Medium Pressure 10 minutes 34*   11/23/2022 Sci Fit Bike Seat 6 for 5 minutes Level 5 Quadriceps sets 2 sets of 10 for 5 seconds Seated straight leg raises 3 sets of 5 with 3# Seated knee flexion 1 minute (per HEP)  Functional Activities: Leg Press Double Leg 75# and 100# 10X each slow eccentrics; Single Leg 37# and 50#  10X each slow eccentrics  Balance: Tandem 3X 20 seconds each eyes open; head moving; eyes closed  Vasopneumatic Lt knee Medium Pressure 10 minutes 34*   11/12/2022 See HEP-  performed trial reps PRN for comprehension, pt with good understanding of initial exercises   Vaso x 10 min to Lt knee, mod pressure, 34 deg     PATIENT EDUCATION:  Education details:HEP Person educated: Patient Education method: Explanation, Demonstration, Tactile cues, Verbal cues, and Handouts Education comprehension: verbalized understanding, returned demonstration, verbal cues required,  tactile cues required, and needs further education   HOME EXERCISE PROGRAM: Access Code: [facility] URL: [email or web address] Date: 11/12/2022 Prepared by: Faustino Congress   Exercises - Supine Quadricep Sets  - 2 x daily - 7 x weekly - 2-3 sets - 10 reps - 5 second hold - Small Range Straight Leg Raise  - 2 x daily - 7 x weekly - 5 sets - 5 reps - 3 seconds hold - Long Sitting Knee Flexion Hold  - 2-3 x daily - 7 x weekly - 1 sets - 5-10 reps - 5-10 minutes hold   ASSESSMENT:   CLINICAL IMPRESSION: [name] is making functional progress towards long-term goals.  Quadriceps strength remains the highest priority with her home and clinic program.  Despite less than the recommended HEP compliance, she is on track to meet LTGs.   OBJECTIVE IMPAIRMENTS: Abnormal gait, decreased activity tolerance, decreased endurance, decreased knowledge of condition, difficulty walking, decreased strength, impaired perceived functional ability, obesity, and pain.    ACTIVITY LIMITATIONS: standing, squatting, stairs, and cycling   PARTICIPATION LIMITATIONS: community activity and workouts   PERSONAL FACTORS: Hyperglycemia, HLD, history of migraines, R shoulder and Bil knee pain are also affecting patient's functional outcome.    REHAB POTENTIAL: Good   CLINICAL DECISION MAKING: Stable/uncomplicated   EVALUATION  COMPLEXITY: Low     GOALS: Goals reviewed with patient? Yes   LONG TERM GOALS: Target date: 12/10/2022   Independent with final HEP Goal status: On Going 12/01/2022   2.  Lt knee AROM improved to 140 deg flexion for improved mobility Goal status: On Going 12/01/2022   3.  Report pain < 2/10 with standing and walking activities for improved function Goal status: On Going 12/01/2022   4.  Amb without significant deviations for improved functional mobility. Goal status: On Going 12/01/2022       PLAN:   PT FREQUENCY: 1x/week   PT DURATION: 4 weeks   PLANNED INTERVENTIONS: Therapeutic exercises, Therapeutic activity, Neuromuscular re-education, Balance training, Gait training, Patient/Family education, Self Care, Joint mobilization, Stair training, Aquatic Therapy, Dry Needling, Electrical stimulation, Cryotherapy, Moist heat, Taping, Vasopneumatic device, Manual therapy, and Re-evaluation   PLAN FOR NEXT SESSION:  FOTO and reassessment.    Farley Ly, PT, MPT 12/01/2022, 4:47 PM

## 2022-12-08 ENCOUNTER — Ambulatory Visit: Payer: Commercial Managed Care - PPO | Admitting: Rehabilitative and Restorative Service Providers"

## 2022-12-08 ENCOUNTER — Encounter: Payer: Self-pay | Admitting: Rehabilitative and Restorative Service Providers"

## 2022-12-08 DIAGNOSIS — M25562 Pain in left knee: Secondary | ICD-10-CM

## 2022-12-08 DIAGNOSIS — M6281 Muscle weakness (generalized): Secondary | ICD-10-CM

## 2022-12-08 DIAGNOSIS — R600 Localized edema: Secondary | ICD-10-CM | POA: Diagnosis not present

## 2022-12-08 DIAGNOSIS — R262 Difficulty in walking, not elsewhere classified: Secondary | ICD-10-CM

## 2022-12-08 DIAGNOSIS — F4320 Adjustment disorder, unspecified: Secondary | ICD-10-CM | POA: Diagnosis not present

## 2022-12-08 NOTE — Therapy (Signed)
OUTPATIENT PHYSICAL THERAPY TREATMENT/PROGRESS NOTE   Patient Name: Jennifer Casey MRN: [id number] DOB:01/09/1966, 57 y.o., female Today's Date: 12/08/2022  END OF SESSION:   PT End of Session - 12/08/22 1703     Visit Number 4    Number of Visits 4    Date for PT Re-Evaluation 12/10/22    PT Start Time 1017    PT Stop Time 1603    PT Time Calculation (min) 45 min    Activity Tolerance Patient tolerated treatment well;No increased pain    Behavior During Therapy [facility] for tasks assessed/performed            Progress Note Reporting Period 11/12/2022 to 12/08/2022  See note below for Objective Data and Assessment of Progress/Goals.      Past Medical History:  Diagnosis Date   Arthritis    Family history of adverse reaction to anesthesia    History of kidney stones    Past Surgical History:  Procedure Laterality Date   COLONOSCOPY     DILATION AND CURETTAGE OF UTERUS     KNEE ARTHROSCOPY WITH MEDIAL MENISECTOMY Left 11/04/2022   Procedure: LEFT KNEE ARTHROSCOPY WITH PARTIAL MEDIAL MENISCECTOMY;  Surgeon: Mcarthur Rossetti, MD;  Location: [facility];  Service: Orthopedics;  Laterality: Left;   Patient Active Problem List   Diagnosis Date Noted   Complex tear of medial meniscus of left knee as current injury 11/04/2022   Acute medial meniscal injury of left knee 11/04/2022   Hyperglycemia 07/07/2022   Hyperlipidemia 04/29/2022   Seasonal allergies 04/29/2022   Multiple lipomas 04/29/2022   History of migraine headaches 04/29/2022   Nephrolithiasis 04/29/2022   Fatty liver 04/28/2022   Vitamin D deficiency 04/28/2022   Pain in joint of right shoulder 03/19/2022   Family history of neoplasm of ovary 51/12/5850   Lichen sclerosus et atrophicus 10/25/2016   Abnormality of gait 10/08/2015   Knee pain, bilateral 05/08/2009     THERAPY DIAG:  Difficulty in walking, not elsewhere classified - Plan: PT plan of care cert/re-cert  Muscle weakness (generalized) -  Plan: PT plan of care cert/re-cert  Left knee pain, unspecified chronicity - Plan: PT plan of care cert/re-cert  Localized edema - Plan: PT plan of care cert/re-cert  PCP: Binnie Rail, MD   REFERRING PROVIDER: Pete Pelt, PA-C   REFERRING DIAG: [phone] (ICD-10-CM) - S/P left knee arthroscopy   THERAPY DIAG:  Difficulty in walking, not elsewhere classified - Plan: PT plan of care cert/re-cert   Muscle weakness (generalized) - Plan: PT plan of care cert/re-cert   Left knee pain, unspecified chronicity - Plan: PT plan of care cert/re-cert   Localized edema - Plan: PT plan of care cert/re-cert   Rationale for Evaluation and Treatment: Rehabilitation   ONSET DATE: 11/04/22   SUBJECTIVE:    SUBJECTIVE STATEMENT: [name] is still using Motrin every day.  Prolonged standing is challenging although she does better with movement.  She is walking more without a limp.  HEP compliance has been on and off with travel for work and is expected to be solid moving forward.   PERTINENT HISTORY: Hyperglycemia, HLD, history of migraines, R shoulder and Bil knee pain   PAIN:  Are you having pain? Yes: NPRS scale: 1-6/10 over the past 7 days Pain location: L medial and anterior knee Pain description: Occasional sharp pain at night, usually aches Aggravating factors: Prolonged standing, worse at night Relieving factors: Motrin, ice   PRECAUTIONS: None   WEIGHT BEARING  RESTRICTIONS: No   FALLS:  Has patient fallen in last 6 months? Yes. 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 Radial tear of the  posterior horn of the medial meniscus towards the meniscal root. 2. Full-thickness cartilage loss of the medial patellofemoral compartment. 3. High-grade partial-thickness cartilage loss of the medial femoral condyle. Partial-thickness cartilage loss the medial tibial plateau.   X-rays show some mild  patellofemoral arthritis.  There is only mild OA without significant joint  narrowing of either compartment.  There is a spur off the posterior aspect  of the proximal tibia on lateral film.  No acute fracture noted.   PATIENT SURVEYS:  11/12/22: FOTO deferred today   COGNITION: Overall cognitive status: Within functional limits for tasks assessed                          EDEMA: Swelling noted in Lt knee     LOWER EXTREMITY ROM:   Active ROM Right 09/29/2022 Left 09/29/2022 Left 12/08/2022  Knee flexion 138 115 141  Knee extension 0 0-2 (hyper-extension) 0   (Blank rows = not tested)   Passive ROM Right 09/29/2022 Left 09/29/2022  Knee flexion   120  Knee extension       (Blank rows = not tested)   LOWER EXTREMITY STRENGTH:  Deferred due to post op status   STRENGTH Right 12/08/2022 Left 1/17/204  Hip flexion      Hip extension      Hip abduction      Hip adduction      Hip internal rotation      Hip external rotation      Knee flexion      Knee extension  73.1 pounds  34.0 pounds  Ankle dorsiflexion      Ankle plantarflexion      Ankle inversion      Ankle eversion       (Blank rows = not tested)   GAIT: 11/12/22  Distance walked: In the clinic Assistive device utilized:  None Level of assistance: Complete Independence Comments: mild antalgic gait on Lt     TODAY'S TREATMENT: DATE:  12/08/2022 Recumbent bike Seat 2 for 5 minutes Level 2-3 Seated straight leg raises 3 sets of 10 with 4#  Functional Activities: Step-up and over 4 inch step and step-down off 6 inch step 3 sets of 10 Sit to stand 10X slow eccentrics  Balance: Tandem 5X 20 seconds each  eyes open    12/01/2022 Recumbent bike Seat 2 for 8 minutes Level 2 Seated straight leg raises 5 sets of 5 with 4#  Functional Activities: Leg Press Double Leg 100# and 125# 10X each slow eccentrics; Single Leg 50# and 62# 10X each slow eccentrics Step-up and over 4 inch step and step-down off 2 inch step 2 sets of 10  Balance: Tandem 2X 20 seconds each eyes open & head moving; eyes closed  Vasopneumatic Lt knee Medium Pressure 10 minutes 34*   11/23/2022 Sci Fit Bike Seat 6 for 5 minutes Level 5 Quadriceps sets 2  sets of 10 for 5 seconds Seated straight leg raises 3 sets of 5 with 3# Seated knee flexion 1 minute (per HEP)  Functional Activities: Leg Press Double Leg 75# and 100# 10X each slow eccentrics; Single Leg 37# and 50# 10X each slow eccentrics  Balance: Tandem 3X 20 seconds each eyes open; head moving; eyes closed  Vasopneumatic Lt knee Medium Pressure 10 minutes 34*   PATIENT EDUCATION:  Education details:HEP Person educated: Patient Education method: Explanation, Demonstration, Tactile cues, Verbal cues, and Handouts Education comprehension: verbalized understanding, returned demonstration, verbal cues required, tactile cues required, and needs further education   HOME EXERCISE PROGRAM: Access Code: [facility] URL: [email or web address] Date: 12/08/2022 Prepared by: Vista Mink  Exercises - Supine Quadricep Sets  - 2 x daily - 7 x weekly - 2-3 sets - 10 reps - 5 second hold - Small Range Straight Leg Raise  - 2 x daily - 3-4 x weekly - 5 sets - 5 reps - 3 seconds hold - Lateral Step Down  - 1 x daily - 3-4 x weekly - 3 sets - 10 reps - Sit to Stand with Armchair  - 2 x daily - 3-4 x weekly - 1 sets - 10 reps - Single Leg Balance  - 1 x daily - 7 x weekly - 1 sets - 5 reps - 10 seconds hold   ASSESSMENT:   CLINICAL IMPRESSION: [name] is making subjective, objective and functional progress towards long-term goals.  Quadriceps strength remains  the highest priority with her home and clinic program as left quadriceps strength is currently less than 47% of the uninvolved right.  As her left quadriceps strength gets closer to 85 to 90%, I expect [name] will have minimal pain with her normal functional activities.  [name] was given the option of a independent rehabilitation with a follow-up in a month to assess strength progress versus continued supervised rehabilitation.  She feels comfortable with her home exercises and will return in a month for strength testing and additional recommendations to be made at that time.   OBJECTIVE IMPAIRMENTS: Abnormal gait, decreased activity tolerance, decreased endurance, decreased knowledge of condition, difficulty walking, decreased strength, impaired perceived functional ability, obesity, and pain.    ACTIVITY LIMITATIONS: standing, squatting, stairs, and cycling   PARTICIPATION LIMITATIONS: community activity and workouts   PERSONAL FACTORS: Hyperglycemia, HLD, history of migraines, R shoulder and Bil knee pain are also affecting patient's functional outcome.    REHAB POTENTIAL: Good   CLINICAL DECISION MAKING: Stable/uncomplicated   EVALUATION COMPLEXITY: Low     GOALS: Goals reviewed with patient? Yes   LONG TERM GOALS: Target date: 12/10/2022   Independent with final HEP Goal status: Met 12/08/2022   2.  Lt knee AROM improved to 140 deg flexion for improved mobility Goal status: Met 12/08/2022   3.  Report pain < 2/10 with standing and walking activities for improved function Goal status: On Going 12/08/2022   4.  Amb without significant deviations for improved functional mobility. Goal status: On Going 12/08/2022       PLAN:   PT FREQUENCY: Follow-up in a month for strength reassessment and additional recommendations   PT DURATION: 4 weeks   PLANNED INTERVENTIONS: Therapeutic exercises, Therapeutic activity, Neuromuscular re-education, Balance training, Gait training,  Patient/Family education, Self Care, Joint mobilization, Stair training, Aquatic Therapy, Dry Needling, Electrical stimulation, Cryotherapy, Moist heat, Taping, Vasopneumatic device, Manual therapy, and Re-evaluation   PLAN FOR NEXT SESSION:  Reassessment.    Farley Ly, PT,  MPT 12/08/2022, 5:12 PM

## 2022-12-09 ENCOUNTER — Ambulatory Visit (INDEPENDENT_AMBULATORY_CARE_PROVIDER_SITE_OTHER): Payer: Commercial Managed Care - PPO | Admitting: Physician Assistant

## 2022-12-09 ENCOUNTER — Encounter: Payer: Self-pay | Admitting: Physician Assistant

## 2022-12-09 DIAGNOSIS — Z9889 Other specified postprocedural states: Secondary | ICD-10-CM

## 2022-12-09 NOTE — Progress Notes (Signed)
HPI: [name] returns today status post left knee arthroscopy 11/04/2022.  She is overall doing well states she is better than she was preop.  Ranks her pain to be 4 out of 10 pain worst at night.  She takes Motrin for the pain and states this really takes care of all the pain in the knees.  She has sharp pains in the medial aspect of the knee standing for long periods of time.  She is going to physical therapy and feels that this is helping.  Physical exam: General well-developed well-nourished female who ambulates without any assistive device.  Nonantalgic gait.  Left knee: Full extension full flexion.  No abnormal warmth erythema.  Port sites are well-healed.  Lateral port site with slight scar tissue.  No instability valgus varus stressing.  Impression: Status post left knee arthroscopy Chondromalacia medial femoral condyle grade 3 to grade 4  Plan: She will continue to work on range of motion and strengthening.  Work on TEFL teacher.  She will continue her Motrin for pain.  Discussed knee friendly exercises with her.  She may benefit from cortisone or supplemental injection in the future.  She follow-up with [place] as needed.

## 2023-01-04 ENCOUNTER — Ambulatory Visit (INDEPENDENT_AMBULATORY_CARE_PROVIDER_SITE_OTHER): Payer: Commercial Managed Care - PPO | Admitting: Obstetrics and Gynecology

## 2023-01-04 ENCOUNTER — Encounter: Payer: Self-pay | Admitting: Obstetrics and Gynecology

## 2023-01-04 ENCOUNTER — Other Ambulatory Visit (HOSPITAL_COMMUNITY): Payer: Self-pay

## 2023-01-04 VITALS — BP 124/82 | HR 79 | Ht 59.5 in | Wt 195.0 lb

## 2023-01-04 DIAGNOSIS — L900 Lichen sclerosus et atrophicus: Secondary | ICD-10-CM | POA: Diagnosis not present

## 2023-01-04 DIAGNOSIS — Z01419 Encounter for gynecological examination (general) (routine) without abnormal findings: Secondary | ICD-10-CM

## 2023-01-04 DIAGNOSIS — N904 Leukoplakia of vulva: Secondary | ICD-10-CM | POA: Insufficient documentation

## 2023-01-04 MED ORDER — CLOBETASOL PROPIONATE 0.05 % EX OINT
1.0000 | TOPICAL_OINTMENT | Freq: Every day | CUTANEOUS | 0 refills | Status: DC | PRN
  Filled 2023-01-04: qty 30, 30d supply, fill #0

## 2023-01-04 NOTE — Progress Notes (Signed)
57 y.o. P2 Married White or Caucasian Not Hispanic or Latino female here for annual exam. No vaginal bleeding. Sexually active, no pain.  She also needs a mammogram.   She has a h/o lichen sclerosis and needs a refill on her Clobetasol ointment. Only occasional flare.     Patient's last menstrual period was 03/25/2015.          Sexually active: Yes.    The current method of family planning is post menopausal status.    Exercising: No.  Exercise is limited by orthopedic condition(s): knee surgery . Smoker:  no  Health Maintenance: Pap:  06/11/2020 was normal, negative HPV.Marland Kitchen  History of abnormal Pap:  no MMG:  11/06/20 normal  BMD:   never  Colonoscopy: at the age of 18. Normal f/u 10 years  TDaP:  06/01/21 Gardasil: n/a   reports that she has never smoked. She has been exposed to tobacco smoke. She has never used smokeless tobacco. She reports current alcohol use of about 1.0 standard drink of alcohol per week. She reports that she does not use drugs. 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 2 kids, both non binary, one is graduating from [employer], other is teacher in [place]. Husband is an infectious disease MD and VP at [employer].   Past Medical History:  Diagnosis Date   Arthritis    Family history of adverse reaction to anesthesia    History of kidney stones     Past Surgical History:  Procedure Laterality Date   COLONOSCOPY     DILATION AND CURETTAGE OF UTERUS     KNEE ARTHROSCOPY WITH MEDIAL MENISECTOMY Left 11/04/2022   Procedure: LEFT KNEE ARTHROSCOPY WITH PARTIAL MEDIAL MENISCECTOMY;  Surgeon: Mcarthur Rossetti, MD;  Location: [facility];  Service: Orthopedics;  Laterality: Left;    Current Outpatient Medications  Medication Sig Dispense Refill   clobetasol ointment (TEMOVATE) [id number] % Apply 1 Application topically daily as needed (lichen sclerosus).     ibuprofen (ADVIL) 200 MG tablet Take 200 mg by mouth every 8 (eight) hours as needed for moderate pain.     loratadine (CLARITIN)  10 MG tablet Take 10 mg by mouth daily as needed for allergies.     mometasone (NASONEX) 50 MCG/ACT nasal spray Place 2 sprays into the nose daily as needed (allergies).     HYDROcodone-acetaminophen (NORCO) 5-325 MG tablet Take 1 tablet by mouth every 6 (six) hours as needed for moderate pain. (Patient not taking: Reported on 11/12/2022) 30 tablet 0   No current facility-administered medications for this visit.    Family History  Problem Relation Age of Onset   Osteoarthritis Mother    Diabetes Father    Heart disease Father    Kidney disease Father    Hyperlipidemia Father    Ovarian cancer Sister    Breast cancer Neg Hx   Sister was 62, negative genetic testing. Currently 74, on long term chemotherapy.   Review of Systems  All other systems reviewed and are negative.   Exam:   BP 124/82   Pulse 79   Ht 4' 11.5" (1.511 m)   Wt 195 lb (88.5 kg)   LMP 03/25/2015   SpO2 100%   BMI 38.73 kg/m   Weight change: @WEIGHTCHANGE$ @ Height:   Height: 4' 11.5" (151.1 cm)  Ht Readings from Last 3 Encounters:  01/04/23 4' 11.5" (1.511 m)  10/27/22 4' 11"$  (1.499 m)  08/23/22 4' 11"$  (1.499 m)    General appearance: alert, cooperative and appears stated  age Head: Normocephalic, without obvious abnormality, atraumatic Neck: no adenopathy, supple, symmetrical, trachea midline and thyroid normal to inspection and palpation Lungs: clear to auscultation bilaterally Cardiovascular: regular rate and rhythm Breasts: normal appearance, no masses or tenderness Abdomen: soft, non-tender; non distended,  no masses,  no organomegaly Extremities: extremities normal, atraumatic, no cyanosis or edema Skin: Skin color, texture, turgor normal. No rashes or lesions Lymph nodes: Cervical, supraclavicular, and axillary nodes normal. No abnormal inguinal nodes palpated Neurologic: Grossly normal   Pelvic: External genitalia:  no lesions, mild loss of architecture, no significant whitening.               Urethra:  normal appearing urethra with no masses, tenderness or lesions              Bartholins and Skenes: normal                 Vagina: normal appearing vagina with normal color and discharge, no lesions              Cervix: no lesions               Bimanual Exam:  Uterus:   no masses or tenderness              Adnexa: no mass, fullness, tenderness               Rectovaginal: Confirms               Anus:  normal sphincter tone, no lesions  Gae Dry, CMA chaperoned for the exam.      1. Well woman exam Discussed breast self exam Discussed calcium and vit D intake Pap due next year Mammogram due, #given to schedule  Colonoscopy UTD

## 2023-01-04 NOTE — Patient Instructions (Signed)

## 2023-01-06 ENCOUNTER — Encounter: Payer: Commercial Managed Care - PPO | Admitting: Rehabilitative and Restorative Service Providers"

## 2023-01-20 ENCOUNTER — Encounter: Payer: Self-pay | Admitting: Rehabilitative and Restorative Service Providers"

## 2023-01-20 ENCOUNTER — Ambulatory Visit: Payer: Commercial Managed Care - PPO | Admitting: Rehabilitative and Restorative Service Providers"

## 2023-01-20 DIAGNOSIS — R600 Localized edema: Secondary | ICD-10-CM

## 2023-01-20 DIAGNOSIS — M25562 Pain in left knee: Secondary | ICD-10-CM | POA: Diagnosis not present

## 2023-01-20 DIAGNOSIS — R262 Difficulty in walking, not elsewhere classified: Secondary | ICD-10-CM | POA: Diagnosis not present

## 2023-01-20 DIAGNOSIS — M6281 Muscle weakness (generalized): Secondary | ICD-10-CM

## 2023-01-20 NOTE — Therapy (Signed)
OUTPATIENT PHYSICAL THERAPY TREATMENT/DISCHARGE NOTE   Patient Name: Jennifer Casey MRN: [id number] DOB:January 02, 1966, 57 y.o., female Today's Date: 01/20/2023  END OF SESSION:   PT End of Session - 01/20/23 1100     Visit Number 5    Number of Visits 4    Date for PT Re-Evaluation 12/10/22    PT Start Time 1021    PT Stop Time 1101    PT Time Calculation (min) 40 min    Activity Tolerance Patient tolerated treatment well;No increased pain;Patient limited by pain    Behavior During Therapy [facility] for tasks assessed/performed            PHYSICAL THERAPY DISCHARGE SUMMARY  Visits from Start of Care: 5  Current functional level related to goals / functional outcomes: See note   Remaining deficits: See note   Education / Equipment: Updated HEP   Patient agrees to discharge. Patient goals were met. Patient is being discharged due to being pleased with the current functional level.   Past Medical History:  Diagnosis Date   Arthritis    Family history of adverse reaction to anesthesia    History of kidney stones    Past Surgical History:  Procedure Laterality Date   COLONOSCOPY     DILATION AND CURETTAGE OF UTERUS     KNEE ARTHROSCOPY WITH MEDIAL MENISECTOMY Left 11/04/2022   Procedure: LEFT KNEE ARTHROSCOPY WITH PARTIAL MEDIAL MENISCECTOMY;  Surgeon: Mcarthur Rossetti, MD;  Location: [facility];  Service: Orthopedics;  Laterality: Left;   Patient Active Problem List   Diagnosis Date Noted   Dystrophy of vulva 01/04/2023   Complex tear of medial meniscus of left knee as current injury 11/04/2022   Acute medial meniscal injury of left knee 11/04/2022   Hyperglycemia 07/07/2022   Hyperlipidemia 04/29/2022   Seasonal allergies 04/29/2022   Multiple lipomas 04/29/2022   History of migraine headaches 04/29/2022   Nephrolithiasis 04/29/2022   Fatty liver 04/28/2022   Vitamin D deficiency 04/28/2022   Pain in joint of right shoulder 03/19/2022   Family history of  neoplasm of ovary [id number]   Lichen sclerosus et atrophicus 10/25/2016   Abnormality of gait 10/08/2015   Knee pain, bilateral 05/08/2009     THERAPY DIAG:  Difficulty in walking, not elsewhere classified - Plan: PT plan of care cert/re-cert  Muscle weakness (generalized) - Plan: PT plan of care cert/re-cert  Left knee pain, unspecified chronicity - Plan: PT plan of care cert/re-cert  Localized edema - Plan: PT plan of care cert/re-cert  PCP: Binnie Rail, MD   REFERRING PROVIDER: Pete Pelt, PA-C   REFERRING DIAG: [phone] (ICD-10-CM) - S/P left knee arthroscopy   THERAPY DIAG:  Difficulty in walking, not elsewhere classified - Plan: PT plan of care cert/re-cert   Muscle weakness (generalized) - Plan: PT plan of care cert/re-cert   Left knee pain, unspecified chronicity - Plan: PT plan of care cert/re-cert   Localized edema - Plan: PT plan of care cert/re-cert   Rationale for Evaluation and Treatment: Rehabilitation   ONSET DATE: 11/04/22   SUBJECTIVE:    SUBJECTIVE STATEMENT: [name] is using 4 Motrin every day, was 16 every day.  Prolonged standing is better while walking a lot is tough.  Sleeping can be interrupted with a lot of standing and walking.  HEP compliance has been 2-3X/week.   PERTINENT HISTORY: Hyperglycemia, HLD, history of migraines, R shoulder and Bil knee pain   PAIN:  Are you having pain? Yes: NPRS scale:  1-6/10 over the past 7 days.  Frequency of high pain is much less (1x/week, was daily) Pain location: L medial and anterior knee Pain description: Occasional sharp pain at night, usually aches Aggravating factors: Prolonged standing, worse at night Relieving factors: Motrin, ice   PRECAUTIONS: None   WEIGHT BEARING RESTRICTIONS: No   FALLS:  Has patient fallen in last 6 months? Yes. 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 Radial tear of the posterior horn of the medial meniscus towards the meniscal root. 2. Full-thickness cartilage loss of the medial patellofemoral compartment. 3. High-grade partial-thickness cartilage loss of the medial femoral condyle. Partial-thickness cartilage loss the medial tibial plateau.   X-rays show some mild  patellofemoral arthritis.  There is only mild OA without significant joint  narrowing of either compartment.  There is a spur off the posterior aspect  of the proximal tibia on lateral film.  No acute fracture noted.   PATIENT SURVEYS:  11/12/22: FOTO deferred today   COGNITION: Overall cognitive status: Within functional limits for tasks assessed                          EDEMA: Swelling noted in Lt knee     LOWER EXTREMITY ROM:   Active ROM Right 09/29/2022 Left 09/29/2022 Left 12/08/2022  Knee flexion 138 115 141  Knee extension 0 0-2 (hyper-extension) 0   (Blank rows = not tested)   Passive ROM Right 09/29/2022 Left 09/29/2022  Knee flexion   120  Knee extension       (Blank rows = not tested)   LOWER EXTREMITY STRENGTH:  Deferred due to post op status   STRENGTH Right 12/08/2022 Left 1/17/204 Left 01/20/2023  Hip flexion       Hip extension       Hip abduction       Hip adduction       Hip internal rotation       Hip external rotation       Knee flexion       Knee extension  73.1 pounds  34.0 pounds 40.3 pounds  Ankle dorsiflexion       Ankle plantarflexion       Ankle inversion       Ankle eversion        (Blank rows = not tested)   GAIT: 11/12/22  Distance walked: In the clinic Assistive device utilized:  None Level of assistance: Complete  Independence Comments: mild antalgic gait on Lt     TODAY'S TREATMENT: DATE:  01/20/2023 Seated straight leg raises 3 sets of 10 with 3#  Functional Activities: Step-up and over 4 inch step and step-down off 4 inch step 2 sets of 10 Sit to stand 10X slow eccentrics  Balance: Single-leg 2X 20 seconds each eyes open; head turning; eyes closed   12/08/2022 Recumbent bike Seat 2 for 5 minutes Level 2-3 Seated straight leg raises 3 sets of 10 with 4#  Functional Activities: Step-up and over 4 inch step and step-down off 6 inch step 3 sets  of 10 Sit to stand 10X slow eccentrics  Balance: Tandem 5X 20 seconds each eyes open    12/01/2022 Recumbent bike Seat 2 for 8 minutes Level 2 Seated straight leg raises 5 sets of 5 with 4#  Functional Activities: Leg Press Double Leg 100# and 125# 10X each slow eccentrics; Single Leg 50# and 62# 10X each slow eccentrics Step-up and over 4 inch step and step-down off 2 inch step 2 sets of 10  Balance: Tandem 2X 20 seconds each eyes open & head moving; eyes closed  Vasopneumatic Lt knee Medium Pressure 10 minutes 34*   PATIENT EDUCATION:  Education details:HEP Person educated: Patient Education method: Explanation, Demonstration, Tactile cues, Verbal cues, and Handouts Education comprehension: verbalized understanding, returned demonstration, verbal cues required, tactile cues required, and needs further education   HOME EXERCISE PROGRAM: Access Code: [facility] URL: [email or web address] Date: 01/20/2023 Prepared by: Vista Mink  Exercises - Supine Quadricep Sets  - 2 x daily - 7 x weekly - 2-3 sets - 10 reps - 5 second hold - Small Range Straight Leg Raise  - 1 x daily - 2-3 x weekly - 3-5 sets - 10 reps - 3 seconds hold - Lateral Step Down  - 1 x daily - 2-3 x weekly - 2-3 sets - 10 reps - Sit to Stand with Armchair  - 1 x daily - 2-3 x weekly - 2 sets - 10 reps - Single Leg Balance  - 1 x daily - 2-3 x weekly - 1  sets - 5 reps - 10 seconds hold   ASSESSMENT:   CLINICAL IMPRESSION: [name] is making slow but significant progress with her post surgical rehabilitation.  She now requires significantly less pain medication for her left knee and notes improved standing and walking endurance.  She can still get significant pain at night with overuse and she reports consistent 3 times per week home exercise program compliance.  [name] is aware that although her left quadriceps strength has improved, it is still low compared to where it should be.  I recommended to her that her left quadricep strength should be at least 50% body weight to allow for significant symptom reduction (currently closer to 20-25%).  With consistent 3 times per week home program participation her prognosis to meet long-term goals is good.  [name] reports comfort with and compliance with her home exercises and appears ready to transfer into more independent rehabilitation.  She is welcome to return at any time if her progress with her independent rehabilitation is not acceptable to her.   OBJECTIVE IMPAIRMENTS: Abnormal gait, decreased activity tolerance, decreased endurance, decreased knowledge of condition, difficulty walking, decreased strength, impaired perceived functional ability, obesity, and pain.    ACTIVITY LIMITATIONS: standing, squatting, stairs, and cycling   PARTICIPATION LIMITATIONS: community activity and workouts   PERSONAL FACTORS: Hyperglycemia, HLD, history of migraines, R shoulder and Bil knee pain are also affecting patient's functional outcome.    REHAB POTENTIAL: Good   CLINICAL DECISION MAKING: Stable/uncomplicated   EVALUATION COMPLEXITY: Low     GOALS: Goals reviewed with patient? Yes   LONG TERM GOALS: Target date: 12/10/2022   Independent with final HEP Goal status: Met 12/08/2022   2.  Lt knee AROM improved to 140 deg flexion for improved mobility Goal status: Met 12/08/2022   3.  Report pain < 2/10  with standing and walking activities for improved function Goal status: On Going 01/20/2023   4.  Amb without significant deviations for improved functional mobility.  Goal status: On Going 01/20/2023       PLAN:   PT FREQUENCY: DC  PT DURATION: DC   PLANNED INTERVENTIONS: Therapeutic exercises, Therapeutic activity, Neuromuscular re-education, Balance training, Gait training, Patient/Family education, Self Care, Joint mobilization, Stair training, Aquatic Therapy, Dry Needling, Electrical stimulation, Cryotherapy, Moist heat, Taping, Vasopneumatic device, Manual therapy, and Re-evaluation   PLAN FOR NEXT SESSION: DC today  Farley Ly, PT, MPT 01/20/2023, 4:46 PM

## 2023-01-27 ENCOUNTER — Encounter: Payer: Self-pay | Admitting: Radiology

## 2023-06-30 ENCOUNTER — Encounter (HOSPITAL_BASED_OUTPATIENT_CLINIC_OR_DEPARTMENT_OTHER): Payer: Commercial Managed Care - PPO | Admitting: Obstetrics & Gynecology

## 2023-11-11 ENCOUNTER — Ambulatory Visit
Admission: RE | Admit: 2023-11-11 | Discharge: 2023-11-11 | Disposition: A | Discharge status: Home / Self Care | Payer: Commercial Managed Care - PPO | Source: Ambulatory Visit | Attending: Internal Medicine | Admitting: Internal Medicine

## 2023-11-11 ENCOUNTER — Other Ambulatory Visit: Payer: Self-pay | Admitting: Internal Medicine

## 2023-11-11 DIAGNOSIS — Z1231 Encounter for screening mammogram for malignant neoplasm of breast: Secondary | ICD-10-CM

## 2023-11-17 DIAGNOSIS — H52203 Unspecified astigmatism, bilateral: Secondary | ICD-10-CM | POA: Diagnosis not present

## 2024-03-01 ENCOUNTER — Other Ambulatory Visit (INDEPENDENT_AMBULATORY_CARE_PROVIDER_SITE_OTHER): Payer: Self-pay

## 2024-03-01 ENCOUNTER — Ambulatory Visit: Admitting: Physician Assistant

## 2024-03-01 DIAGNOSIS — M25561 Pain in right knee: Secondary | ICD-10-CM | POA: Diagnosis not present

## 2024-03-01 NOTE — Progress Notes (Signed)
 Office Visit Note   Patient: Jennifer Casey           Date of Birth: [birth year]           MRN: [id number] Visit Date: 03/01/2024              Requested by: Pincus Sanes, MD [address] [place],  [place] [address] PCP: Pincus Sanes, MD   Assessment & Plan: Visit Diagnoses:  1. Acute pain of right knee     Plan: Given the fact that her pain is similar to what she has in the left knee, which she had a radial tear of the posterior horn of the medial meniscus.  Recommend MRI to rule out meniscal tear.  Follow-up after MRI to go over results and discuss further treatment Dr. Magnus Ivan.  She may benefit from a cortisone injection in the future in the right knee.  Recommended no high-impact activities or twisting activities.  She will continue take Motrin and use her cane in the left hand.  Follow-Up Instructions: Return for After MRI.   Orders:  Orders Placed This Encounter  Procedures   XR Knee 1-2 Views Right   No orders of the defined types were placed in this encounter.     Procedures: No procedures performed   Clinical Data: No additional findings.   Subjective: Chief Complaint  Patient presents with   Right Knee - Pain    HPI Jennifer Casey is a 58 year old female well-known to Dr. Raye Sorrow service comes in today with right knee pain.  She states [place] when she felt a pop in the posterior medial aspect of her knee and was unable to bear weight at time of the injury.  She notes no catching locking but giving way sensation.  Has pain mostly medial aspect of the knee.  She has been taking Motrin.  She is having to use a cane to ambulate.  States the knee pain similar to the pain she had when she had a meniscal tear left knee for which she underwent knee arthroscopy.  She has done well in regards to her left knee status post left knee arthroscopy.  She does work with a Psychologist, educational and also cycles therefore keep her quads  strong.  Review of Systems See HPI otherwise negative  Objective: Vital Signs: LMP 03/25/2015   Physical Exam General: Well-developed well-nourished female no acute distress ambulates with antalgic gait with a cane in her left hand. Psych: Alert and oriented x 3 Respirations: Unlabored on room air  Ortho Exam Bilateral knees: Full extension full flexion bilateral knees.  Negative anterior drawer bilaterally.  Negative McMurray's bilaterally.  Slight effusion right knee no effusion left knee.  Tenderness right knee along medial joint line particularly posterior aspect of the medial joint line.  Specialty Comments:  No specialty comments available.  Imaging: XR Knee 1-2 Views Right Result Date: 03/01/2024 Right knee 2 views: Knee is well located.  No acute fractures acute findings..  Mild narrowing medial joint line.  Lateral joint lines well-preserved.  Osteophyte off the lateral femoral condyle is noted.  Patellofemoral joint overall well-preserved.  No bony lesions or abnormalities.    PMFS History: Patient Active Problem List   Diagnosis Date Noted   Dystrophy of vulva 01/04/2023   Complex tear of medial meniscus of left knee as current injury 11/04/2022   Acute medial meniscal injury of left knee 11/04/2022   Hyperglycemia 07/07/2022   Hyperlipidemia  04/29/2022   Seasonal allergies 04/29/2022   Multiple lipomas 04/29/2022   History of migraine headaches 04/29/2022   Nephrolithiasis 04/29/2022   Fatty liver 04/28/2022   Vitamin D deficiency 04/28/2022   Pain in joint of right shoulder 03/19/2022   Family history of neoplasm of ovary 07/19/2019   Lichen sclerosus et atrophicus 10/25/2016   Abnormality of gait 10/08/2015   Knee pain, bilateral 05/08/2009   Past Medical History:  Diagnosis Date   Arthritis    Family history of adverse reaction to anesthesia    History of kidney stones     Family History  Problem Relation Age of Onset   Osteoarthritis Mother     Diabetes Father    Heart disease Father    Kidney disease Father    Hyperlipidemia Father    Ovarian cancer Sister 38   Breast cancer Neg Hx    BRCA 1/2 Neg Hx     Past Surgical History:  Procedure Laterality Date   COLONOSCOPY     DILATION AND CURETTAGE OF UTERUS     KNEE ARTHROSCOPY WITH MEDIAL MENISECTOMY Left 11/04/2022   Procedure: LEFT KNEE ARTHROSCOPY WITH PARTIAL MEDIAL MENISCECTOMY;  Surgeon: Kathryne Hitch, MD;  Location: [facility];  Service: Orthopedics;  Laterality: Left;   Social History   Occupational History   Not on file  Tobacco Use   Smoking status: Never    Passive exposure: Past   Smokeless tobacco: Never  Vaping Use   Vaping status: Never Used  Substance and Sexual Activity   Alcohol use: Yes    Alcohol/week: 1.0 standard drink of alcohol    Types: 1 Glasses of wine per week    Comment: occas.   Drug use: Never   Sexual activity: Yes    Partners: Male    Birth control/protection: Post-menopausal

## 2024-03-02 ENCOUNTER — Other Ambulatory Visit: Payer: Self-pay | Admitting: Radiology

## 2024-03-02 ENCOUNTER — Ambulatory Visit
Admission: RE | Admit: 2024-03-02 | Discharge: 2024-03-02 | Disposition: A | Discharge status: Home / Self Care | Source: Ambulatory Visit | Attending: Physician Assistant | Admitting: Physician Assistant

## 2024-03-02 DIAGNOSIS — M25561 Pain in right knee: Secondary | ICD-10-CM

## 2024-03-02 DIAGNOSIS — M23321 Other meniscus derangements, posterior horn of medial meniscus, right knee: Secondary | ICD-10-CM | POA: Diagnosis not present

## 2024-03-02 DIAGNOSIS — M1711 Unilateral primary osteoarthritis, right knee: Secondary | ICD-10-CM | POA: Diagnosis not present

## 2024-03-02 DIAGNOSIS — M25461 Effusion, right knee: Secondary | ICD-10-CM | POA: Diagnosis not present

## 2024-03-05 NOTE — Telephone Encounter (Signed)
 Sure thing lauren can you have her come in Wednesday thx

## 2024-03-07 ENCOUNTER — Encounter: Payer: Self-pay | Admitting: Orthopedic Surgery

## 2024-03-07 ENCOUNTER — Ambulatory Visit: Admitting: Orthopedic Surgery

## 2024-03-07 DIAGNOSIS — M25561 Pain in right knee: Secondary | ICD-10-CM

## 2024-03-09 ENCOUNTER — Encounter: Payer: Self-pay | Admitting: Orthopedic Surgery

## 2024-03-09 ENCOUNTER — Other Ambulatory Visit

## 2024-03-09 NOTE — Progress Notes (Signed)
 Office Visit Note   Patient: Jennifer Casey           Date of Birth: 1966/02/03           MRN: [id number] Visit Date: 03/07/2024 Requested by: Colene Dauphin, MD [address] [place],  [place] [address] PCP: Colene Dauphin, MD  Subjective: Chief Complaint  Patient presents with   Right Knee - Pain    HPI: Jennifer Casey is a 58 y.o. female who presents to the office reporting right knee pain.  Patient states that 1 week ago she was walking across campus and she felt a pop and her leg collapsed.  Reports pain behind the knee on the medial side radiating down to the calf.  She is going to [place] for 2 weeks starting in late May.  Has been taking Motrin for symptoms.  She did have a history of left knee posterior medial meniscal tear which was treated with debridement.  Took her about a year but she did eventually get over that injury and surgery.  She has had an MRI scan which is reviewed.  Right knee MRI scan shows radial tear near the meniscal root.  May or may not involve the entire width of the meniscus.  It looks essentially similar to the left knee MRI scan..                ROS: All systems reviewed are negative as they relate to the chief complaint within the history of present illness.  Patient denies fevers or chills.  Assessment & Plan: Visit Diagnoses:  1. Acute pain of right knee     Plan: Impression is right knee posterior horn medial meniscal tear.  Hard to say if this is a true meniscal root avulsion or just a partial tear with some meniscal remnant in place.  The meniscus itself is not extruded but there is an effusion.  Based on the lack of certainty regarding the MRI scan interpretation in both the axial coronal and sagittal planes coupled with the impending timeline for travel her best option would be an injection in the about 1 week before her leave date and then follow-up after that for consideration of arthroscopic intervention which would be either  debridement or meniscal root repair.  Follow-Up Instructions: No follow-ups on file.   Orders:  No orders of the defined types were placed in this encounter.  No orders of the defined types were placed in this encounter.     Procedures: No procedures performed   Clinical Data: No additional findings.  Objective: Vital Signs: LMP 03/25/2015   Physical Exam:  Constitutional: Patient appears well-developed HEENT:  Head: Normocephalic Eyes:EOM are normal Neck: Normal range of motion Cardiovascular: Normal rate Pulmonary/chest: Effort normal Neurologic: Patient is alert Skin: Skin is warm Psychiatric: Patient has normal mood and affect  Ortho Exam: Ortho exam demonstrates full range of motion of the right knee.  Does have some medial joint line tenderness.  Mild effusion is present.  Collateral and cruciate ligaments are stable.  Equivocal McMurray compression testing.  Pedal pulses palpable.  Lacks a little bit of full flexion on the right compared to the left.  Specialty Comments:  No specialty comments available.  Imaging: No results found.   PMFS History: Patient Active Problem List   Diagnosis Date Noted   Dystrophy of vulva 01/04/2023   Complex tear of medial meniscus of left knee as current injury 11/04/2022   Acute medial meniscal injury  of left knee 11/04/2022   Hyperglycemia 07/07/2022   Hyperlipidemia 04/29/2022   Seasonal allergies 04/29/2022   Multiple lipomas 04/29/2022   History of migraine headaches 04/29/2022   Nephrolithiasis 04/29/2022   Fatty liver 04/28/2022   Vitamin D  deficiency 04/28/2022   Pain in joint of right shoulder 03/19/2022   Family history of neoplasm of ovary 07/19/2019   Lichen sclerosus et atrophicus 10/25/2016   Abnormality of gait 10/08/2015   Knee pain, bilateral 05/08/2009   Past Medical History:  Diagnosis Date   Arthritis    Family history of adverse reaction to anesthesia    History of kidney stones     Family  History  Problem Relation Age of Onset   Osteoarthritis Mother    Diabetes Father    Heart disease Father    Kidney disease Father    Hyperlipidemia Father    Ovarian cancer Sister 74   Breast cancer Neg Hx    BRCA 1/2 Neg Hx     Past Surgical History:  Procedure Laterality Date   COLONOSCOPY     DILATION AND CURETTAGE OF UTERUS     KNEE ARTHROSCOPY WITH MEDIAL MENISECTOMY Left 11/04/2022   Procedure: LEFT KNEE ARTHROSCOPY WITH PARTIAL MEDIAL MENISCECTOMY;  Surgeon: Arnie Lao, MD;  Location: [facility];  Service: Orthopedics;  Laterality: Left;   Social History   Occupational History   Not on file  Tobacco Use   Smoking status: Never    Passive exposure: Past   Smokeless tobacco: Never  Vaping Use   Vaping status: Never Used  Substance and Sexual Activity   Alcohol use: Yes    Alcohol/week: 1.0 standard drink of alcohol    Types: 1 Glasses of wine per week    Comment: occas.   Drug use: Never   Sexual activity: Yes    Partners: Male    Birth control/protection: Post-menopausal

## 2024-03-14 ENCOUNTER — Ambulatory Visit: Admitting: Physician Assistant

## 2024-04-04 ENCOUNTER — Encounter (HOSPITAL_BASED_OUTPATIENT_CLINIC_OR_DEPARTMENT_OTHER): Payer: Self-pay | Admitting: Orthopedic Surgery

## 2024-04-11 ENCOUNTER — Ambulatory Visit: Admitting: Orthopedic Surgery

## 2024-04-11 ENCOUNTER — Encounter (HOSPITAL_BASED_OUTPATIENT_CLINIC_OR_DEPARTMENT_OTHER)
Admission: RE | Admit: 2024-04-11 | Discharge: 2024-04-11 | Disposition: A | Discharge status: Home / Self Care | Source: Ambulatory Visit | Attending: Orthopedic Surgery | Admitting: Orthopedic Surgery

## 2024-04-11 ENCOUNTER — Other Ambulatory Visit (HOSPITAL_COMMUNITY): Payer: Self-pay

## 2024-04-11 DIAGNOSIS — Z01812 Encounter for preprocedural laboratory examination: Secondary | ICD-10-CM | POA: Insufficient documentation

## 2024-04-11 DIAGNOSIS — S83249A Other tear of medial meniscus, current injury, unspecified knee, initial encounter: Secondary | ICD-10-CM

## 2024-04-11 LAB — CBC
HCT: 41.1 % (ref 36.0–46.0)
Hemoglobin: 14.2 g/dL (ref 12.0–15.0)
MCH: 31.5 pg (ref 26.0–34.0)
MCHC: 34.5 g/dL (ref 30.0–36.0)
MCV: 91.1 fL (ref 80.0–100.0)
Platelets: 200 10*3/uL (ref 150–400)
RBC: 4.51 MIL/uL (ref 3.87–5.11)
RDW: 12 % (ref 11.5–15.5)
WBC: 4.4 10*3/uL (ref 4.0–10.5)
nRBC: 0 % (ref 0.0–0.2)

## 2024-04-11 LAB — BASIC METABOLIC PANEL WITH GFR
Anion gap: 9 (ref 5–15)
BUN: 14 mg/dL (ref 6–20)
CO2: 27 mmol/L (ref 22–32)
Calcium: 9.3 mg/dL (ref 8.9–10.3)
Chloride: 104 mmol/L (ref 98–111)
Creatinine, Ser: 0.9 mg/dL (ref 0.44–1.00)
GFR, Estimated: >60 mL/min (ref >60)
Glucose, Bld: 122 mg/dL — ABNORMAL HIGH (ref 70–99)
Potassium: 3.7 mmol/L (ref 3.5–5.1)
Sodium: 140 mmol/L (ref 135–145)

## 2024-04-11 MED ORDER — ZOLPIDEM TARTRATE 5 MG PO TABS
5.0000 mg | ORAL_TABLET | Freq: Every evening | ORAL | 0 refills | Status: DC | PRN
  Filled 2024-04-11: qty 15, 15d supply, fill #0

## 2024-04-11 NOTE — Progress Notes (Signed)

## 2024-04-12 ENCOUNTER — Encounter: Payer: Self-pay | Admitting: Orthopedic Surgery

## 2024-04-12 DIAGNOSIS — S83241A Other tear of medial meniscus, current injury, right knee, initial encounter: Secondary | ICD-10-CM | POA: Diagnosis not present

## 2024-04-12 MED ORDER — LIDOCAINE HCL 1 % IJ SOLN
5.0000 mL | INTRAMUSCULAR | Status: AC | PRN
  Administered 2024-04-12: 5 mL

## 2024-04-12 MED ORDER — BUPIVACAINE HCL 0.25 % IJ SOLN
4.0000 mL | INTRAMUSCULAR | Status: AC | PRN
  Administered 2024-04-12: 4 mL via INTRA_ARTICULAR

## 2024-04-12 NOTE — Progress Notes (Signed)
   Procedure Note  Patient: Jennifer Casey             Date of Birth: September 29, 1966           MRN: [id number]             Visit Date: 04/11/2024  Procedures: Visit Diagnoses:  1. Acute radial tear of medial meniscus     Large Joint Inj: L knee on 04/12/2024 5:57 PM Indications: diagnostic evaluation, joint swelling and pain Details: 18 G 1.5 in needle, superolateral approach  Arthrogram: No  Medications: 5 mL lidocaine  1 %; 4 mL bupivacaine  0.25 % Outcome: tolerated well, no immediate complications Procedure, treatment alternatives, risks and benefits explained, specific risks discussed. Consent was given by the patient. Immediately prior to procedure a time out was called to verify the correct patient, procedure, equipment, support staff and site/side marked as required. Patient was prepped and draped in the usual sterile fashion.    Toradol  injected.    Jennifer Casey has surgery planned for 616 which will be right knee arthroscopy with meniscal repair versus debridement.  She is going on a trip to [place] within a week.  Still hard for her to stand.  She does have a mild effusion on exam and we took out 20 cc today and injected Toradol  for some pain relief prior to her surgery.  Wanted to hold off on steroid injection because of this very small risk of infection which is slightly increased with cortisone injection closer to the time of surgery.  Also prescribed Ambien to help her sleep on the plane.

## 2024-05-07 ENCOUNTER — Other Ambulatory Visit: Payer: Self-pay

## 2024-05-07 ENCOUNTER — Ambulatory Visit (HOSPITAL_BASED_OUTPATIENT_CLINIC_OR_DEPARTMENT_OTHER): Admitting: Anesthesiology

## 2024-05-07 ENCOUNTER — Ambulatory Visit: Admitting: Orthopedic Surgery

## 2024-05-07 ENCOUNTER — Encounter (HOSPITAL_BASED_OUTPATIENT_CLINIC_OR_DEPARTMENT_OTHER)
Admission: RE | Disposition: A | Discharge status: Home / Self Care | Payer: Self-pay | Source: Home / Self Care | Attending: Orthopedic Surgery

## 2024-05-07 ENCOUNTER — Ambulatory Visit (HOSPITAL_BASED_OUTPATIENT_CLINIC_OR_DEPARTMENT_OTHER)
Admission: RE | Admit: 2024-05-07 | Discharge: 2024-05-07 | Disposition: A | Discharge status: Home / Self Care | Attending: Orthopedic Surgery | Admitting: Orthopedic Surgery

## 2024-05-07 ENCOUNTER — Other Ambulatory Visit (HOSPITAL_COMMUNITY): Payer: Self-pay

## 2024-05-07 ENCOUNTER — Other Ambulatory Visit: Payer: Self-pay | Admitting: Orthopedic Surgery

## 2024-05-07 ENCOUNTER — Encounter (HOSPITAL_BASED_OUTPATIENT_CLINIC_OR_DEPARTMENT_OTHER): Payer: Self-pay | Admitting: Orthopedic Surgery

## 2024-05-07 DIAGNOSIS — Z539 Procedure and treatment not carried out, unspecified reason: Secondary | ICD-10-CM | POA: Diagnosis not present

## 2024-05-07 DIAGNOSIS — E669 Obesity, unspecified: Secondary | ICD-10-CM | POA: Insufficient documentation

## 2024-05-07 DIAGNOSIS — X58XXXA Exposure to other specified factors, initial encounter: Secondary | ICD-10-CM | POA: Insufficient documentation

## 2024-05-07 DIAGNOSIS — Z6838 Body mass index (BMI) 38.0-38.9, adult: Secondary | ICD-10-CM | POA: Insufficient documentation

## 2024-05-07 DIAGNOSIS — S83241A Other tear of medial meniscus, current injury, right knee, initial encounter: Secondary | ICD-10-CM | POA: Diagnosis not present

## 2024-05-07 DIAGNOSIS — Z01818 Encounter for other preprocedural examination: Secondary | ICD-10-CM

## 2024-05-07 DIAGNOSIS — M1990 Unspecified osteoarthritis, unspecified site: Secondary | ICD-10-CM | POA: Diagnosis not present

## 2024-05-07 SURGERY — ARTHROSCOPY, KNEE, WITH MENISCUS REPAIR
Anesthesia: General | Site: Knee | Laterality: Right

## 2024-05-07 MED ORDER — POVIDONE-IODINE 10 % EX SWAB
2.0000 | Freq: Once | CUTANEOUS | Status: AC
  Administered 2024-05-07: 2 via TOPICAL

## 2024-05-07 MED ORDER — ZOLPIDEM TARTRATE 5 MG PO TABS
5.0000 mg | ORAL_TABLET | Freq: Every evening | ORAL | 0 refills | Status: DC | PRN
  Filled 2024-05-07: qty 15, 15d supply, fill #0

## 2024-05-07 MED ORDER — CEFAZOLIN SODIUM-DEXTROSE 2-4 GM/100ML-% IV SOLN: 2.0000 g | INTRAVENOUS | Status: DC

## 2024-05-07 MED ORDER — ACETAMINOPHEN 500 MG PO TABS
ORAL_TABLET | ORAL | Status: AC
  Filled 2024-05-07: qty 2

## 2024-05-07 MED ORDER — POVIDONE-IODINE 7.5 % EX SOLN: Freq: Once | CUTANEOUS | Status: DC

## 2024-05-07 MED ORDER — ACETAMINOPHEN 500 MG PO TABS
1000.0000 mg | ORAL_TABLET | Freq: Once | ORAL | Status: AC
  Administered 2024-05-07: 1000 mg via ORAL

## 2024-05-07 MED ORDER — FENTANYL CITRATE (PF) 100 MCG/2ML IJ SOLN
INTRAMUSCULAR | Status: AC
  Filled 2024-05-07: qty 2

## 2024-05-07 MED ORDER — MIDAZOLAM HCL 2 MG/2ML IJ SOLN
INTRAMUSCULAR | Status: AC
  Filled 2024-05-07: qty 2

## 2024-05-07 MED ORDER — CEFAZOLIN SODIUM-DEXTROSE 2-4 GM/100ML-% IV SOLN
INTRAVENOUS | Status: AC
  Filled 2024-05-07: qty 100

## 2024-05-07 SURGICAL SUPPLY — 54 items
BANDAGE ESMARK 6X9 LF (GAUZE/BANDAGES/DRESSINGS) IMPLANT
BLADE EXCALIBUR 4.0X13 (MISCELLANEOUS) IMPLANT
BLADE SURG 15 STRL LF DISP TIS (BLADE) ×1 IMPLANT
BNDG ELASTIC 4INX 5YD STR LF (GAUZE/BANDAGES/DRESSINGS) ×1 IMPLANT
BNDG ELASTIC 6INX 5YD STR LF (GAUZE/BANDAGES/DRESSINGS) ×1 IMPLANT
CLSR STERI-STRIP ANTIMIC 1/2X4 (GAUZE/BANDAGES/DRESSINGS) ×1 IMPLANT
COOLER ICEMAN CLASSIC (MISCELLANEOUS) ×1 IMPLANT
CUFF TRNQT CYL 34X4.125X (TOURNIQUET CUFF) IMPLANT
DISSECTOR 3.8MM X 13CM (MISCELLANEOUS) ×1 IMPLANT
DRAPE ARTHROSCOPY W/POUCH 90 (DRAPES) ×1 IMPLANT
DRAPE IMP U-DRAPE 54X76 (DRAPES) ×1 IMPLANT
DRAPE INCISE IOBAN 66X45 STRL (DRAPES) IMPLANT
DRAPE U-SHAPE 47X51 STRL (DRAPES) ×2 IMPLANT
DRSG TEGADERM 2-3/8X2-3/4 SM (GAUZE/BANDAGES/DRESSINGS) IMPLANT
DRSG TEGADERM 4X4.75 (GAUZE/BANDAGES/DRESSINGS) ×2 IMPLANT
DURAPREP 26ML APPLICATOR (WOUND CARE) ×1 IMPLANT
DW OUTFLOW CASSETTE/TUBE SET (MISCELLANEOUS) IMPLANT
ELECTRODE REM PT RTRN 9FT ADLT (ELECTROSURGICAL) IMPLANT
EXCALIBUR 3.8MM X 13CM (MISCELLANEOUS) IMPLANT
GAUZE 4X4 16PLY ~~LOC~~+RFID DBL (SPONGE) IMPLANT
GAUZE SPONGE 4X4 12PLY STRL (GAUZE/BANDAGES/DRESSINGS) ×1 IMPLANT
GAUZE XEROFORM 1X8 LF (GAUZE/BANDAGES/DRESSINGS) ×1 IMPLANT
GLOVE BIO SURGEON STRL SZ7 (GLOVE) ×1 IMPLANT
GLOVE BIO SURGEON STRL SZ8 (GLOVE) ×1 IMPLANT
GLOVE BIOGEL PI IND STRL 7.0 (GLOVE) ×1 IMPLANT
GLOVE BIOGEL PI IND STRL 8 (GLOVE) ×1 IMPLANT
GOWN STRL REUS W/ TWL LRG LVL3 (GOWN DISPOSABLE) ×1 IMPLANT
GOWN STRL REUS W/ TWL XL LVL3 (GOWN DISPOSABLE) ×1 IMPLANT
HOLDER KNEE FOAM BLUE (MISCELLANEOUS) IMPLANT
IMMOBILIZER KNEE 22 UNIV (SOFTGOODS) IMPLANT
IMMOBILIZER KNEE 24 UNIV (MISCELLANEOUS) IMPLANT
MANIFOLD NEPTUNE II (INSTRUMENTS) ×1 IMPLANT
NDL SAFETY ECLIPSE 18X1.5 (NEEDLE) ×2 IMPLANT
NEEDLE HYPO 18GX1.5 BLUNT FILL (NEEDLE) ×1 IMPLANT
PACK ARTHROSCOPY DSU (CUSTOM PROCEDURE TRAY) ×1 IMPLANT
PACK BASIN DAY SURGERY FS (CUSTOM PROCEDURE TRAY) ×1 IMPLANT
PAD COLD SHLDR WRAP-ON (PAD) ×1 IMPLANT
PADDING CAST COTTON 6X4 STRL (CAST SUPPLIES) ×1 IMPLANT
PENCIL SMOKE EVACUATOR (MISCELLANEOUS) IMPLANT
SPONGE T-LAP 4X18 ~~LOC~~+RFID (SPONGE) IMPLANT
SUT ETHILON 3 0 PS 1 (SUTURE) ×1 IMPLANT
SUT MENISCAL KIT (KITS) IMPLANT
SUT MNCRL AB 3-0 PS2 18 (SUTURE) IMPLANT
SUT VIC AB 1 CT1 27XBRD ANBCTR (SUTURE) IMPLANT
SUT VIC AB 2-0 SH 27XBRD (SUTURE) IMPLANT
SUT VIC AB 3-0 FS2 27 (SUTURE) IMPLANT
SUT VICRYL 0 UR6 27IN ABS (SUTURE) IMPLANT
SYR 5ML LL (SYRINGE) ×1 IMPLANT
SYR TB 1ML LL NO SAFETY (SYRINGE) ×1 IMPLANT
TOWEL GREEN STERILE FF (TOWEL DISPOSABLE) ×1 IMPLANT
TRAY DSU PREP LF (CUSTOM PROCEDURE TRAY) ×1 IMPLANT
TUBING ARTHROSCOPY IRRIG 16FT (MISCELLANEOUS) ×1 IMPLANT
WAND ABLATOR APOLLO I90 (BUR) IMPLANT
WRAP KNEE MAXI GEL POST OP (GAUZE/BANDAGES/DRESSINGS) ×1 IMPLANT

## 2024-05-07 NOTE — Discharge Instructions (Signed)
 F/u with dr dean for rescheduling procedure

## 2024-05-07 NOTE — Anesthesia Preprocedure Evaluation (Addendum)
 Anesthesia Evaluation  Patient identified by MRN, date of birth, ID band Patient awake    Reviewed: Allergy & Precautions, H&P , NPO status , Patient's Chart, lab work & pertinent test results  History of Anesthesia Complications (+) Family history of anesthesia reaction and history of anesthetic complications (maternal ponv)  Airway Mallampati: IV  TM Distance: >3 FB Neck ROM: Full    Dental  (+) Teeth Intact, Dental Advisory Given   Pulmonary  Snores at night, no sleep study    Pulmonary exam normal breath sounds clear to auscultation       Cardiovascular negative cardio ROS Normal cardiovascular exam Rhythm:Regular Rate:Normal     Neuro/Psych negative neurological ROS  negative psych ROS   GI/Hepatic negative GI ROS, Neg liver ROS,,,  Endo/Other  Obesity BMI 38  Renal/GU negative Renal ROS  negative genitourinary   Musculoskeletal  (+) Arthritis , Osteoarthritis,    Abdominal  (+) + obese  Peds negative pediatric ROS (+)  Hematology negative hematology ROS (+)   Anesthesia Other Findings   Reproductive/Obstetrics negative OB ROS                             Anesthesia Physical Anesthesia Plan  ASA: 2  Anesthesia Plan: General   Post-op Pain Management: Tylenol  PO (pre-op)* and Toradol  IV (intra-op)*   Induction: Intravenous  PONV Risk Score and Plan: 3 and Ondansetron , Dexamethasone , Midazolam  and Treatment may vary due to age or medical condition  Airway Management Planned: LMA  Additional Equipment: None  Intra-op Plan:   Post-operative Plan: Extubation in OR  Informed Consent: I have reviewed the patients History and Physical, chart, labs and discussed the procedure including the risks, benefits and alternatives for the proposed anesthesia with the patient or authorized representative who has indicated his/her understanding and acceptance.     Dental advisory  given  Plan Discussed with: CRNA  Anesthesia Plan Comments: (Case cancelled in preop by surgeon d/t abrasion on operative knee )       Anesthesia Quick Evaluation

## 2024-05-07 NOTE — H&P (Signed)
 Jennifer Casey is an 58 y.o. female.   Chief Complaint: right knee pain HPI: Jennifer Casey is a 58 y.o. female who presents reporting right knee pain.  Patient states that 9 week ago she was walking across campus and she felt a pop and her right leg collapsed.  Reports pain behind the knee on the medial side radiating down to the calf.  She is going to [place] for 2 weeks starting in late May.  Has been taking Motrin for symptoms.  She did have a history of left knee posterior medial meniscal tear which was treated with debridement.  Took her about a year but she did eventually get over that injury and surgery.  She has had an MRI scan which is reviewed.   Right knee MRI scan shows radial tear near the meniscal root.  May or may not involve the entire width of the meniscus.  It looks essentially similar to the left knee MRI scan..    Past Medical History:  Diagnosis Date   Arthritis    Family history of adverse reaction to anesthesia    History of kidney stones     Past Surgical History:  Procedure Laterality Date   COLONOSCOPY     DILATION AND CURETTAGE OF UTERUS     KNEE ARTHROSCOPY WITH MEDIAL MENISECTOMY Left 11/04/2022   Procedure: LEFT KNEE ARTHROSCOPY WITH PARTIAL MEDIAL MENISCECTOMY;  Surgeon: Arnie Lao, MD;  Location: [facility];  Service: Orthopedics;  Laterality: Left;    Family History  Problem Relation Age of Onset   Osteoarthritis Mother    Diabetes Father    Heart disease Father    Kidney disease Father    Hyperlipidemia Father    Ovarian cancer Sister 53   Breast cancer Neg Hx    BRCA 1/2 Neg Hx    Social History:  reports that she has never smoked. She has been exposed to tobacco smoke. She has never used smokeless tobacco. She reports current alcohol use of about 1.0 standard drink of alcohol per week. She reports that she does not use drugs.  Allergies: No Known Allergies  Medications Prior to Admission  Medication Sig Dispense Refill    ibuprofen (ADVIL) 200 MG tablet Take 200 mg by mouth every 8 (eight) hours as needed for moderate pain.     loratadine (CLARITIN) 10 MG tablet Take 10 mg by mouth daily as needed for allergies.     mometasone (NASONEX) 50 MCG/ACT nasal spray Place 2 sprays into the nose daily as needed (allergies).     zolpidem  (AMBIEN ) 5 MG tablet Take 1 tablet (5 mg total) by mouth at bedtime as needed for insomnia 15 tablet 0   clobetasol  ointment (TEMOVATE ) 0.05 % Apply 1 Application topically daily as needed (lichen sclerosus). 60 g 0    No results found for this or any previous visit (from the past 48 hours). No results found.  Review of Systems  Musculoskeletal:  Positive for arthralgias.  All other systems reviewed and are negative.   Temperature 98.4 F (36.9 C), temperature source Temporal, resp. rate 16, height 4' 11 (1.499 m), weight 86 kg, last menstrual period 03/25/2015. Physical Exam Vitals reviewed.  HENT:     Head: Normocephalic.     Nose: Nose normal.     Mouth/Throat:     Mouth: Mucous membranes are moist.   Eyes:     Pupils: Pupils are equal, round, and reactive to light.    Cardiovascular:  Rate and Rhythm: Normal rate.     Pulses: Normal pulses.  Pulmonary:     Effort: Pulmonary effort is normal.  Abdominal:     General: Abdomen is flat.   Musculoskeletal:     Cervical back: Normal range of motion.   Skin:    General: Skin is warm.     Capillary Refill: Capillary refill takes less than 2 seconds.   Neurological:     General: No focal deficit present.     Mental Status: She is alert.   Psychiatric:        Mood and Affect: Mood normal.    Ortho exam demonstrates full range of motion of the right knee. Does have some medial joint line tenderness. Mild effusion is present. Collateral and cruciate ligaments are stable. Equivocal McMurray compression testing. Pedal pulses palpable. Lacks a little bit of full flexion on the right compared to the left.    Assessment/Plan Impression is right knee posterior horn medial meniscal tear. Hard to say if this is a true meniscal root avulsion or just a partial tear with some meniscal remnant in place. The meniscus itself is not extruded but there is an effusion. Based on the lack of certainty regarding the MRI scan interpretation in both the axial coronal and sagittal planes coupled with the impending timeline for travel her best option would be an injection in the about 1 week before her leave date which has been performed.  Surgical plan at this time is diagnostic and operative arthroscopy with meniscal debridement versus repair of the meniscal root.  If this is clear-cut complete meniscal root avulsion with gapping between the posterior horn attachment site and meniscal tissue then repair could be performed.  If there is not a complete tear then meniscal debridement could be performed as it was on her contralateral knee.  Risk and benefits are discussed including the alternate time scales for recovery in terms of weightbearing and nonweightbearing.  All questions answered  Marykay Snipes, MD 05/07/2024, 6:46 AM

## 2024-05-08 ENCOUNTER — Other Ambulatory Visit (HOSPITAL_COMMUNITY): Payer: Self-pay

## 2024-05-14 ENCOUNTER — Other Ambulatory Visit: Payer: Self-pay

## 2024-05-14 ENCOUNTER — Encounter (HOSPITAL_COMMUNITY): Payer: Self-pay | Admitting: Orthopedic Surgery

## 2024-05-14 ENCOUNTER — Encounter: Admitting: Orthopedic Surgery

## 2024-05-14 NOTE — Progress Notes (Signed)
 PCP - Dr Harlene Hope Cardiologist - none  Chest x-ray - n/a EKG - n/a Stress Test - n/a ECHO - n/a Cardiac Cath - n/a  ICD Pacemaker/Loop - n/a  Sleep Study -  n/a CPAP - none  Diabetes - n/a  Aspirin & Blood Thinner Instructions:  n/a  ERAS - clear liquids til 8:15 AM DOS. G2 Drink was given at previous PAT appt and surgery was cancelled.  Instructions reviewed w/patient for G2 drink on DOS.  Anesthesia review: no  STOP now taking any Aspirin (unless otherwise instructed by your surgeon), Aleve, Naproxen, Ibuprofen, Motrin, Advil, Goody's, BC's, all herbal medications, fish oil, and all vitamins.   Coronavirus Screening Do you have any of the following symptoms:  Cough yes/no: No Fever (>100.45F)  yes/no: No Runny nose yes/no: No Sore throat yes/no: No Difficulty breathing/shortness of breath  yes/no: No  Have you traveled in the last 14 days and where? yes/no: No  Patient verbalized understanding of instructions that were given via phone.

## 2024-05-15 ENCOUNTER — Encounter (HOSPITAL_COMMUNITY): Payer: Self-pay | Admitting: Orthopedic Surgery

## 2024-05-15 ENCOUNTER — Ambulatory Visit (HOSPITAL_COMMUNITY): Admitting: Anesthesiology

## 2024-05-15 ENCOUNTER — Other Ambulatory Visit (HOSPITAL_COMMUNITY): Payer: Self-pay

## 2024-05-15 ENCOUNTER — Other Ambulatory Visit: Payer: Self-pay

## 2024-05-15 ENCOUNTER — Encounter (HOSPITAL_COMMUNITY)
Admission: RE | Disposition: A | Discharge status: Home / Self Care | Payer: Self-pay | Source: Home / Self Care | Attending: Orthopedic Surgery

## 2024-05-15 ENCOUNTER — Ambulatory Visit (HOSPITAL_COMMUNITY)
Admission: RE | Admit: 2024-05-15 | Discharge: 2024-05-15 | Disposition: A | Discharge status: Home / Self Care | Attending: Orthopedic Surgery | Admitting: Orthopedic Surgery

## 2024-05-15 DIAGNOSIS — Z86718 Personal history of other venous thrombosis and embolism: Secondary | ICD-10-CM | POA: Insufficient documentation

## 2024-05-15 DIAGNOSIS — S83241D Other tear of medial meniscus, current injury, right knee, subsequent encounter: Secondary | ICD-10-CM

## 2024-05-15 DIAGNOSIS — Y9301 Activity, walking, marching and hiking: Secondary | ICD-10-CM | POA: Insufficient documentation

## 2024-05-15 DIAGNOSIS — Z87891 Personal history of nicotine dependence: Secondary | ICD-10-CM | POA: Diagnosis not present

## 2024-05-15 DIAGNOSIS — S83241A Other tear of medial meniscus, current injury, right knee, initial encounter: Secondary | ICD-10-CM | POA: Insufficient documentation

## 2024-05-15 DIAGNOSIS — X500XXA Overexertion from strenuous movement or load, initial encounter: Secondary | ICD-10-CM | POA: Diagnosis not present

## 2024-05-15 HISTORY — PX: KNEE ARTHROSCOPY WITH MENISCAL REPAIR: SHX5653

## 2024-05-15 HISTORY — DX: Headache, unspecified: R51.9

## 2024-05-15 SURGERY — ARTHROSCOPY, KNEE, WITH MENISCUS REPAIR
Anesthesia: General | Site: Knee | Laterality: Right

## 2024-05-15 MED ORDER — GLYCOPYRROLATE 0.2 MG/ML IJ SOLN
INTRAMUSCULAR | Status: DC | PRN
  Administered 2024-05-15: .4 mg via INTRAVENOUS

## 2024-05-15 MED ORDER — POVIDONE-IODINE 10 % EX SWAB
2.0000 | Freq: Once | CUTANEOUS | Status: AC
  Administered 2024-05-15: 2 via TOPICAL

## 2024-05-15 MED ORDER — HYDROMORPHONE HCL 1 MG/ML IJ SOLN
INTRAMUSCULAR | Status: AC
  Filled 2024-05-15: qty 0.5

## 2024-05-15 MED ORDER — KETOROLAC TROMETHAMINE 30 MG/ML IJ SOLN
INTRAMUSCULAR | Status: AC
  Filled 2024-05-15: qty 1

## 2024-05-15 MED ORDER — LIDOCAINE 2% (20 MG/ML) 5 ML SYRINGE
INTRAMUSCULAR | Status: AC
  Filled 2024-05-15: qty 5

## 2024-05-15 MED ORDER — DEXAMETHASONE SODIUM PHOSPHATE 10 MG/ML IJ SOLN
INTRAMUSCULAR | Status: DC | PRN
  Administered 2024-05-15: 10 mg via INTRAVENOUS

## 2024-05-15 MED ORDER — BUPIVACAINE HCL (PF) 0.25 % IJ SOLN
INTRAMUSCULAR | Status: DC | PRN
  Administered 2024-05-15: 30 mL

## 2024-05-15 MED ORDER — SODIUM CHLORIDE 0.9 % IR SOLN
Status: DC | PRN
  Administered 2024-05-15: 3000 mL
  Administered 2024-05-15: 6000 mL
  Administered 2024-05-15: 3000 mL

## 2024-05-15 MED ORDER — EPINEPHRINE PF 1 MG/ML IJ SOLN
INTRAMUSCULAR | Status: AC
  Filled 2024-05-15: qty 4

## 2024-05-15 MED ORDER — METHYLPREDNISOLONE ACETATE 40 MG/ML IJ SUSP
INTRAMUSCULAR | Status: DC | PRN
Start: 2024-05-15 — End: 2024-05-15

## 2024-05-15 MED ORDER — BUPIVACAINE-EPINEPHRINE (PF) 0.25% -1:200000 IJ SOLN
INTRAMUSCULAR | Status: AC
  Filled 2024-05-15: qty 60

## 2024-05-15 MED ORDER — DEXAMETHASONE SODIUM PHOSPHATE 10 MG/ML IJ SOLN
INTRAMUSCULAR | Status: AC
  Filled 2024-05-15: qty 1

## 2024-05-15 MED ORDER — ACETAMINOPHEN 500 MG PO TABS
1000.0000 mg | ORAL_TABLET | Freq: Once | ORAL | Status: AC
  Administered 2024-05-15: 1000 mg via ORAL
  Filled 2024-05-15: qty 2

## 2024-05-15 MED ORDER — ORAL CARE MOUTH RINSE: 15.0000 mL | Freq: Once | OROMUCOSAL | Status: AC

## 2024-05-15 MED ORDER — EPHEDRINE SULFATE-NACL 50-0.9 MG/10ML-% IV SOSY
PREFILLED_SYRINGE | INTRAVENOUS | Status: DC | PRN
  Administered 2024-05-15: 10 mg via INTRAVENOUS

## 2024-05-15 MED ORDER — AMISULPRIDE (ANTIEMETIC) 5 MG/2ML IV SOLN
10.0000 mg | Freq: Once | INTRAVENOUS | Status: AC | PRN
  Administered 2024-05-15: 10 mg via INTRAVENOUS

## 2024-05-15 MED ORDER — LACTATED RINGERS IV SOLN: INTRAVENOUS | Status: DC

## 2024-05-15 MED ORDER — KETOROLAC TROMETHAMINE 30 MG/ML IJ SOLN
INTRAMUSCULAR | Status: DC | PRN
Start: 2024-05-15 — End: 2024-05-15
  Administered 2024-05-15: 30 mg via INTRAVENOUS

## 2024-05-15 MED ORDER — ONDANSETRON HCL 4 MG/2ML IJ SOLN
INTRAMUSCULAR | Status: AC
  Filled 2024-05-15: qty 2

## 2024-05-15 MED ORDER — CEFAZOLIN SODIUM-DEXTROSE 2-4 GM/100ML-% IV SOLN
2.0000 g | INTRAVENOUS | Status: AC
  Administered 2024-05-15: 2 g via INTRAVENOUS
  Filled 2024-05-15: qty 100

## 2024-05-15 MED ORDER — OXYCODONE HCL 5 MG PO TABS: 5.0000 mg | ORAL_TABLET | Freq: Once | ORAL | Status: DC | PRN

## 2024-05-15 MED ORDER — MORPHINE SULFATE (PF) 4 MG/ML IV SOLN
INTRAVENOUS | Status: DC | PRN
  Administered 2024-05-15: 8 mg

## 2024-05-15 MED ORDER — PROPOFOL 10 MG/ML IV BOLUS
INTRAVENOUS | Status: DC | PRN
  Administered 2024-05-15: 200 mg via INTRAVENOUS

## 2024-05-15 MED ORDER — MIDAZOLAM HCL 2 MG/2ML IJ SOLN
INTRAMUSCULAR | Status: AC
  Filled 2024-05-15: qty 2

## 2024-05-15 MED ORDER — POVIDONE-IODINE 7.5 % EX SOLN
Freq: Once | CUTANEOUS | Status: DC
  Filled 2024-05-15: qty 118

## 2024-05-15 MED ORDER — FENTANYL CITRATE (PF) 250 MCG/5ML IJ SOLN
INTRAMUSCULAR | Status: AC
  Filled 2024-05-15: qty 5

## 2024-05-15 MED ORDER — FENTANYL CITRATE (PF) 100 MCG/2ML IJ SOLN: 25.0000 ug | INTRAMUSCULAR | Status: DC | PRN

## 2024-05-15 MED ORDER — MORPHINE SULFATE (PF) 4 MG/ML IV SOLN
INTRAVENOUS | Status: AC
Start: 2024-05-15 — End: 2024-05-15
  Filled 2024-05-15: qty 2

## 2024-05-15 MED ORDER — PROPOFOL 10 MG/ML IV BOLUS
INTRAVENOUS | Status: AC
  Filled 2024-05-15: qty 20

## 2024-05-15 MED ORDER — OXYCODONE HCL 5 MG/5ML PO SOLN: 5.0000 mg | Freq: Once | ORAL | Status: DC | PRN

## 2024-05-15 MED ORDER — METHYLPREDNISOLONE ACETATE 40 MG/ML IJ SUSP
INTRAMUSCULAR | Status: AC
  Filled 2024-05-15: qty 1

## 2024-05-15 MED ORDER — OXYCODONE HCL 5 MG PO TABS
5.0000 mg | ORAL_TABLET | ORAL | 0 refills | Status: DC | PRN
  Filled 2024-05-15: qty 30, 5d supply, fill #0

## 2024-05-15 MED ORDER — HYDROMORPHONE HCL 1 MG/ML IJ SOLN
INTRAMUSCULAR | Status: DC | PRN
  Administered 2024-05-15 (×2): .25 mg via INTRAVENOUS

## 2024-05-15 MED ORDER — AMISULPRIDE (ANTIEMETIC) 5 MG/2ML IV SOLN
INTRAVENOUS | Status: AC
  Filled 2024-05-15: qty 4

## 2024-05-15 MED ORDER — ASPIRIN 81 MG PO CHEW
81.0000 mg | CHEWABLE_TABLET | Freq: Two times a day (BID) | ORAL | 0 refills | Status: AC
  Filled 2024-05-15: qty 60, 30d supply, fill #0

## 2024-05-15 MED ORDER — MIDAZOLAM HCL 2 MG/2ML IJ SOLN
INTRAMUSCULAR | Status: DC | PRN
  Administered 2024-05-15: 2 mg via INTRAVENOUS

## 2024-05-15 MED ORDER — CLONIDINE HCL (ANALGESIA) 100 MCG/ML EP SOLN
EPIDURAL | Status: AC
Start: 2024-05-15 — End: 2024-05-15
  Filled 2024-05-15: qty 10

## 2024-05-15 MED ORDER — ONDANSETRON HCL 4 MG/2ML IJ SOLN
INTRAMUSCULAR | Status: DC | PRN
  Administered 2024-05-15: 4 mg via INTRAVENOUS

## 2024-05-15 MED ORDER — CHLORHEXIDINE GLUCONATE 0.12 % MT SOLN
15.0000 mL | Freq: Once | OROMUCOSAL | Status: AC
Start: 2024-05-15 — End: 2024-05-15
  Administered 2024-05-15: 15 mL via OROMUCOSAL
  Filled 2024-05-15: qty 15

## 2024-05-15 MED ORDER — KETOROLAC TROMETHAMINE 30 MG/ML IJ SOLN
INTRAMUSCULAR | Status: AC
Start: 2024-05-15 — End: 2024-05-15
  Filled 2024-05-15: qty 1

## 2024-05-15 MED ORDER — METHOCARBAMOL 500 MG PO TABS
500.0000 mg | ORAL_TABLET | Freq: Three times a day (TID) | ORAL | 1 refills | Status: DC | PRN
  Filled 2024-05-15: qty 30, 10d supply, fill #0

## 2024-05-15 MED ORDER — CLONIDINE HCL (ANALGESIA) 100 MCG/ML EP SOLN
EPIDURAL | Status: DC | PRN
  Administered 2024-05-15: .5 mL via INTRA_ARTICULAR

## 2024-05-15 MED ORDER — LIDOCAINE 2% (20 MG/ML) 5 ML SYRINGE
INTRAMUSCULAR | Status: DC | PRN
  Administered 2024-05-15: 60 mg via INTRAVENOUS

## 2024-05-15 MED ORDER — PROMETHAZINE (PHENERGAN) 6.25MG IN NS 50ML IVPB
6.2500 mg | INTRAVENOUS | Status: AC
  Administered 2024-05-15: 6.25 mg via INTRAVENOUS
  Filled 2024-05-15: qty 50

## 2024-05-15 MED ORDER — FENTANYL CITRATE (PF) 250 MCG/5ML IJ SOLN
INTRAMUSCULAR | Status: DC | PRN
  Administered 2024-05-15 (×2): 25 ug via INTRAVENOUS
  Administered 2024-05-15: 50 ug via INTRAVENOUS
  Administered 2024-05-15 (×6): 25 ug via INTRAVENOUS

## 2024-05-15 SURGICAL SUPPLY — 59 items
BANDAGE ESMARK 6X9 LF (GAUZE/BANDAGES/DRESSINGS) IMPLANT
BLADE EXCALIBUR 4.0X13 (MISCELLANEOUS) IMPLANT
BLADE SURG 15 STRL LF DISP TIS (BLADE) ×1 IMPLANT
BNDG ELASTIC 4INX 5YD STR LF (GAUZE/BANDAGES/DRESSINGS) ×1 IMPLANT
BNDG ELASTIC 6INX 5YD STR LF (GAUZE/BANDAGES/DRESSINGS) ×1 IMPLANT
CLSR STERI-STRIP ANTIMIC 1/2X4 (GAUZE/BANDAGES/DRESSINGS) ×1 IMPLANT
COOLER ICEMAN CLASSIC (MISCELLANEOUS) ×1 IMPLANT
CUFF TRNQT CYL 34X4.125X (TOURNIQUET CUFF) IMPLANT
DISSECTOR 3.8MM X 13CM (MISCELLANEOUS) ×1 IMPLANT
DRAPE ARTHROSCOPY W/POUCH 90 (DRAPES) ×1 IMPLANT
DRAPE IMP U-DRAPE 54X76 (DRAPES) ×1 IMPLANT
DRAPE INCISE IOBAN 66X45 STRL (DRAPES) IMPLANT
DRAPE U-SHAPE 47X51 STRL (DRAPES) ×2 IMPLANT
DRSG TEGADERM 2-3/8X2-3/4 SM (GAUZE/BANDAGES/DRESSINGS) IMPLANT
DRSG TEGADERM 4X4.5 CHG (GAUZE/BANDAGES/DRESSINGS) IMPLANT
DRSG TEGADERM 4X4.75 (GAUZE/BANDAGES/DRESSINGS) ×2 IMPLANT
DURAPREP 26ML APPLICATOR (WOUND CARE) ×1 IMPLANT
DW OUTFLOW CASSETTE/TUBE SET (MISCELLANEOUS) IMPLANT
ELECTRODE REM PT RTRN 9FT ADLT (ELECTROSURGICAL) IMPLANT
EXCALIBUR 3.8MM X 13CM (MISCELLANEOUS) IMPLANT
GAUZE 4X4 16PLY ~~LOC~~+RFID DBL (SPONGE) IMPLANT
GAUZE SPONGE 4X4 12PLY STRL (GAUZE/BANDAGES/DRESSINGS) ×1 IMPLANT
GAUZE XEROFORM 1X8 LF (GAUZE/BANDAGES/DRESSINGS) ×1 IMPLANT
GAUZE XEROFORM 5X9 LF (GAUZE/BANDAGES/DRESSINGS) IMPLANT
GLOVE BIO SURGEON STRL SZ7 (GLOVE) ×1 IMPLANT
GLOVE BIO SURGEON STRL SZ8 (GLOVE) ×1 IMPLANT
GLOVE BIOGEL PI IND STRL 7.0 (GLOVE) ×1 IMPLANT
GLOVE BIOGEL PI IND STRL 8 (GLOVE) ×1 IMPLANT
GOWN STRL REUS W/ TWL LRG LVL3 (GOWN DISPOSABLE) ×1 IMPLANT
GOWN STRL REUS W/ TWL XL LVL3 (GOWN DISPOSABLE) ×1 IMPLANT
HOLDER KNEE FOAM BLUE (MISCELLANEOUS) IMPLANT
IMMOBILIZER KNEE 22 UNIV (SOFTGOODS) IMPLANT
IMMOBILIZER KNEE 24 THIGH 36 (MISCELLANEOUS) IMPLANT
KIT SUTLOC MENISCAL ROOT REP (Anchor) ×2 IMPLANT
MANIFOLD NEPTUNE II (INSTRUMENTS) ×1 IMPLANT
NDL HYPO 18GX1.5 BLUNT FILL (NEEDLE) ×1 IMPLANT
NDL SAFETY ECLIPSE 18X1.5 (NEEDLE) ×2 IMPLANT
NEEDLE HYPO 18GX1.5 BLUNT FILL (NEEDLE) ×1 IMPLANT
PACK ARTHROSCOPY DSU (CUSTOM PROCEDURE TRAY) ×1 IMPLANT
PACK BASIN DAY SURGERY FS (CUSTOM PROCEDURE TRAY) ×1 IMPLANT
PAD COLD SHLDR WRAP-ON (PAD) ×1 IMPLANT
PADDING CAST COTTON 6X4 STRL (CAST SUPPLIES) ×1 IMPLANT
PENCIL SMOKE EVACUATOR (MISCELLANEOUS) IMPLANT
SPONGE T-LAP 4X18 ~~LOC~~+RFID (SPONGE) IMPLANT
SUT ETHILON 3 0 PS 1 (SUTURE) ×1 IMPLANT
SUT MENISCAL KIT (KITS) IMPLANT
SUT MNCRL AB 3-0 PS2 18 (SUTURE) IMPLANT
SUT VIC AB 1 CT1 27XBRD ANBCTR (SUTURE) IMPLANT
SUT VIC AB 2-0 SH 27XBRD (SUTURE) IMPLANT
SUT VIC AB 3-0 FS2 27 (SUTURE) IMPLANT
SUT VICRYL 0 UR6 27IN ABS (SUTURE) IMPLANT
SUTURE TAPE TIGERLINK 1.3MM BL (SUTURE) IMPLANT
SYR 5ML LL (SYRINGE) ×1 IMPLANT
SYR TB 1ML LL NO SAFETY (SYRINGE) ×1 IMPLANT
TOWEL GREEN STERILE FF (TOWEL DISPOSABLE) ×1 IMPLANT
TRAY DSU PREP LF (CUSTOM PROCEDURE TRAY) ×1 IMPLANT
TUBING ARTHROSCOPY IRRIG 16FT (MISCELLANEOUS) ×1 IMPLANT
WAND ABLATOR APOLLO I90 (BUR) IMPLANT
WRAP KNEE MAXI GEL POST OP (GAUZE/BANDAGES/DRESSINGS) ×1 IMPLANT

## 2024-05-15 NOTE — Progress Notes (Signed)
 Per Dr. FABIENE Needle, ok to use lab work from 04/11/24 for today's surgery.

## 2024-05-15 NOTE — Brief Op Note (Signed)
   05/15/2024  6:47 PM  PATIENT:  Asberry ONEIDA Hasten  58 y.o. female  PRE-OPERATIVE DIAGNOSIS:  right knee medial meniscal tear  POST-OPERATIVE DIAGNOSIS:  right knee medial meniscal tear  PROCEDURE:  Procedure(s): ARTHROSCOPY, KNEE, WITH MENISCUS REPAIR  SURGEON:  Surgeon(s): Addie Cordella Hamilton, MD  ASSISTANT: Magnant PA  ANESTHESIA:   General  EBL: 5 ml    Total I/O In: 1100 [I.V.:1000; IV Piggyback:100] Out: 15 [Blood:15]  BLOOD ADMINISTERED: none  DRAINS: None  LOCAL MEDICATIONS USED: Marcaine  morphine  clonidine  SPECIMEN:  No Specimen  COUNTS:  YES  TOURNIQUET:  * Missing tourniquet times found for documented tourniquets in log: [id number] *  DICTATION: .Other Dictation: donePLAN OF CARE: Discharge to home after PACU  PATIENT DISPOSITION:  PACU - hemodynamically stable

## 2024-05-15 NOTE — H&P (Addendum)
 Jennifer Casey is an 58 y.o. female.   Chief Complaint: Right knee pain HPI: Jennifer Casey is a 58 y.o. female who presents reporting right knee pain.  Patient states that 9 week ago she was walking across campus and she felt a pop and her right leg collapsed.  Reports pain behind the knee on the medial side radiating down to the calf.  She is going to [place] for 2 weeks starting in late May.  Has been taking Motrin for symptoms.  She did have a history of left knee posterior medial meniscal tear which was treated with debridement.  Took her about a year but she did eventually get over that injury and surgery.  She has had an MRI scan which is reviewed.   Right knee MRI scan shows radial tear near the meniscal root.  May or may not involve the entire width of the meniscus.  It looks essentially similar to the left knee MRI scan..  Will schedule for surgery last week but she sustained a fall with abrasion around the right knee a day before surgery was scheduled.  The skin has cleared up in the intervening week and she is not ready for arthroscopy   Past Medical History:  Diagnosis Date   Arthritis    DVT (deep venous thrombosis) (HCC) 11/11/2022   left lower ext.   Family history of adverse reaction to anesthesia    mother had really nausea   Headache    none since menopause   History of kidney stones    passed stone    Past Surgical History:  Procedure Laterality Date   COLONOSCOPY     DILATION AND CURETTAGE OF UTERUS     KNEE ARTHROSCOPY WITH MEDIAL MENISECTOMY Left 11/04/2022   Procedure: LEFT KNEE ARTHROSCOPY WITH PARTIAL MEDIAL MENISCECTOMY;  Surgeon: Vernetta Lonni GRADE, MD;  Location: [facility];  Service: Orthopedics;  Laterality: Left;    Family History  Problem Relation Age of Onset   Osteoarthritis Mother    Diabetes Father    Heart disease Father    Kidney disease Father    Hyperlipidemia Father    Ovarian cancer Sister 56   Breast cancer Neg Hx    BRCA 1/2 Neg  Hx    Social History:  reports that she has quit smoking. Her smoking use included cigarettes. She has been exposed to tobacco smoke. She has never used smokeless tobacco. She reports current alcohol use of about 2.0 - 3.0 standard drinks of alcohol per week. She reports that she does not use drugs.  Allergies: No Known Allergies  Medications Prior to Admission  Medication Sig Dispense Refill   acetaminophen  (TYLENOL ) 500 MG tablet Take 500 mg by mouth every 6 (six) hours as needed for moderate pain (pain score 4-6).     ibuprofen (ADVIL) 200 MG tablet Take 200 mg by mouth every 8 (eight) hours as needed for moderate pain.     loratadine (CLARITIN) 10 MG tablet Take 10 mg by mouth daily.     zolpidem  (AMBIEN ) 5 MG tablet Take 1 tablet (5 mg total) by mouth at bedtime as needed for insomnia 15 tablet 0   clobetasol  ointment (TEMOVATE ) 0.05 % Apply 1 Application topically daily as needed (lichen sclerosus). (Patient not taking: Reported on 05/10/2024) 60 g 0    No results found for this or any previous visit (from the past 48 hours). No results found.  Review of Systems  Musculoskeletal:  Positive for arthralgias.  All  other systems reviewed and are negative.   Blood pressure (!) 164/89, pulse 88, temperature 98.6 F (37 C), temperature source Oral, resp. rate 18, height 4' 11 (1.499 m), weight 83.5 kg, last menstrual period 03/25/2015, SpO2 95%. Physical Exam Vitals reviewed.  HENT:     Head: Normocephalic.     Nose: Nose normal.     Mouth/Throat:     Mouth: Mucous membranes are moist.   Eyes:     Pupils: Pupils are equal, round, and reactive to light.    Cardiovascular:     Rate and Rhythm: Normal rate.     Pulses: Normal pulses.  Pulmonary:     Effort: Pulmonary effort is normal.  Abdominal:     General: Abdomen is flat.   Musculoskeletal:     Cervical back: Normal range of motion.   Skin:    General: Skin is warm.     Capillary Refill: Capillary refill takes less  than 2 seconds.   Neurological:     General: No focal deficit present.     Mental Status: She is alert.   Psychiatric:        Mood and Affect: Mood normal.    Ortho exam demonstrates full range of motion of the right knee. Does have some medial joint line tenderness. Mild effusion is present. Collateral and cruciate ligaments are stable. Equivocal McMurray compression testing. Pedal pulses palpable. Lacks a little bit of full flexion on the right compared to the left.  Previous abrasions around the anterior knee which were generally punctate in nature have healed nicely.  Assessment/Plan Impression is right knee posterior horn medial meniscal tear. Hard to say if this is a true meniscal root avulsion or just a partial tear with some meniscal remnant in place. The meniscus itself is not extruded but there is an effusion. Surgical plan at this time is diagnostic and operative arthroscopy with meniscal debridement versus repair of the meniscal root.  If this is clear-cut complete meniscal root avulsion with gapping between the posterior horn attachment site and meniscal tissue then repair could be performed.  If there is not a complete tear then meniscal debridement could be performed as it was on her contralateral knee.  Risk and benefits are discussed including the alternate time scales for recovery in terms of weightbearing and nonweightbearing.  All questions answered.  History of DVT in the left lower extremity 2 years ago in the chart.  However upon discussion with her about that and her husband the patient denies ever having a DVT.  Plan will be for standard DVT prophylaxis which consist of aspirin after surgery.Jennifer KANDICE Glendia Addie, MD 05/15/2024, 10:35 AM

## 2024-05-15 NOTE — Op Note (Signed)
 Jennifer Casey, Jennifer Casey MEDICAL RECORD NO: [id number] ACCOUNT NO: [id number] DATE OF BIRTH: Oct 11, 1966 FACILITY: [facility] LOCATION: [facility] PHYSICIAN: Cordella RAMAN. Addie, MD  Operative Report   DATE OF PROCEDURE: 05/15/2024  PREOPERATIVE DIAGNOSIS: Right knee meniscal root tear.  POSTOPERATIVE DIAGNOSIS: Right knee meniscal root tear.  PROCEDURE: Right knee arthroscopy with meniscal root repair using Arthrex all-inside suture anchor technique.  SURGEON: Cordella RAMAN. Addie, MD.  ASSISTANT: Creed Primes Magnet, [place].  INDICATIONS: The patient is a 58 year old with right knee pain. The patient has near complete meniscal root tear by MRI scanning. The patient presents for operative management after explanation of the risks and benefits.  DESCRIPTION OF PROCEDURE: The patient was brought to the operating room where general anesthetic was induced. Preoperative antibiotics were administered. Timeout was called. The right leg was prescrubbed with alcohol and Betadine , allowed to air dry.   Prepped with DuraPrep solution and draped in a sterile manner. Effusion was present, but range of motion was full. Collateral and cruciate ligaments were stable. After calling timeout, anterior, inferolateral, and anterior inferomedial portals were  established. Diagnostic arthroscopy was performed. The patient's lateral compartment was intact with mild meniscal fraying, which was debrided. Overall, minimal grade I changes in the lateral femoral condyle and lateral tibial plateau. Undersurface of  the patella had mild grade I changes but no exposed subchondral bone on the medial and lateral facet. ACL and PCL intact. The patient did have grade IV changes over the proximal aspect of the medial femoral condyle. This area of grade IV change did not  really engage the weightbearing surface in full extension on the medial side. The patient also had a medial meniscal root tear with grade I chondromalacia over about 60% of  both the medial femoral condyle and the medial femoral plateau and the medial  tibial plateau. The meniscal root tear was about 90-95% complete. There was some waviness of the meniscus indicating redundancy. The meniscus itself was otherwise intact except for the near-complete meniscal root detachment. At this time, the decision  was made to proceed with meniscal root repair. Using a mace rasp, the landing zone for the meniscal root repair was prepared. Bone quality was excellent. After thorough rasping to obtain a bleeding bony bed, a guide was used to drill a guide pin under  fluoroscopy into the posterior aspect of the knee coming out at the landing zone of that posteromedial meniscal root attachment site. After the sutures were placed, they were taken out through the lateral portal. Passport was placed into the medial  portal. At this time, the meniscal repair construct was cinched on the undersurface of the cortical bone. Then, we passed the passing sutures in Mason-Allen fashion through the meniscus in order to obtain a very secure repair. The sutures were then  tightened with the knee in about 30 degrees of extension. Very good stability and repair construct was created. Probed extensively with very good locking of the construct into the prepared bony bed. Thorough irrigation of the joint was performed.  Instruments were removed. Portals were closed using 3-0 Vicryl and 3-0 nylon. The tibial drill guide incision was closed using 3-0 Vicryl and 3-0 nylon. A solution of Marcaine , morphine , and clonidine  was injected into the knee for postoperative pain  relief. Impervious dressings, Ace wrap and knee immobilizer were placed. The patient tolerated the procedure well without immediate complications and was transferred to the recovery room in stable condition. Luke's assistance was required for positioning  and suturing. His  assistance was in medical necessity.    SUJ D: 05/15/2024 6:54:19 pm T:  05/15/2024 11:33:00 pm  JOB: [id number]/ [id number]

## 2024-05-15 NOTE — Progress Notes (Signed)
 Orthopedic Tech Progress Note Patient Details:  Jennifer Casey 1966/08/04 [id number]  Ortho Devices Type of Ortho Device: Crutches Ortho Device/Splint Interventions: Ordered, Application, AdjustmentPACU RN called requesting a pair of CRUTCHES. I did get patient up to work with crutches but she was still feeling the pain medicine which made it a little difficult to use, but she did take a few steps with me and a few other Rns for support   Post Interventions Patient Tolerated: Well, Ambulated well Instructions Provided: Adjustment of device, Care of device  Delanna LITTIE Pac 05/15/2024, 4:23 PM

## 2024-05-15 NOTE — Transfer of Care (Cosign Needed)
 Immediate Anesthesia Transfer of Care Note  Patient: Jennifer Casey  Procedure(s) Performed: ARTHROSCOPY, KNEE, WITH MENISCUS REPAIR (Right: Knee)  Patient Location: PACU  Anesthesia Type:General  Level of Consciousness: awake and oriented  Airway & Oxygen Therapy: Patient Spontanous Breathing and Patient connected to nasal cannula oxygen  Post-op Assessment: Report given to RN, Post -op Vital signs reviewed and stable, and Patient moving all extremities  Post vital signs: Reviewed and stable  Last Vitals:  Vitals Value Taken Time  BP 147/78 05/15/24 14:00  Temp    Pulse 88 05/15/24 14:03  Resp 12 05/15/24 14:03  SpO2 94 % 05/15/24 14:03  Vitals shown include unfiled device data.  Last Pain:  Vitals:   05/15/24 0923  TempSrc:   PainSc: 0-No pain      Patients Stated Pain Goal: 0 (05/15/24 0914)  Complications: There were no known notable events for this encounter.

## 2024-05-15 NOTE — Anesthesia Procedure Notes (Signed)
 Procedure Name: Intubation Date/Time: 05/15/2024 11:43 AM  Performed by: Catheline Terrall NOVAK, RNPre-anesthesia Checklist: Patient identified, Emergency Drugs available, Patient being monitored, Suction available and Timeout performed Patient Re-evaluated:Patient Re-evaluated prior to induction Oxygen Delivery Method: Circle system utilized Preoxygenation: Pre-oxygenation with 100% oxygen Induction Type: IV induction Ventilation: Mask ventilation without difficulty LMA: LMA inserted LMA Size: 3.0 Tube type: Oral Number of attempts: 1 Placement Confirmation: positive ETCO2 and breath sounds checked- equal and bilateral Tube secured with: Tape Dental Injury: Teeth and Oropharynx as per pre-operative assessment

## 2024-05-15 NOTE — Anesthesia Preprocedure Evaluation (Signed)
 Anesthesia Evaluation  Patient identified by MRN, date of birth, ID band Patient awake    Reviewed: Allergy & Precautions, NPO status , Patient's Chart, lab work & pertinent test results  Airway Mallampati: IV  TM Distance: <3 FB Neck ROM: Full    Dental   Pulmonary former smoker   Pulmonary exam normal        Cardiovascular + DVT   Rhythm:Regular Rate:Normal     Neuro/Psych negative neurological ROS     GI/Hepatic negative GI ROS, Neg liver ROS,,,  Endo/Other  negative endocrine ROS    Renal/GU Renal disease (nephrolithiasis)     Musculoskeletal  (+) Arthritis ,    Abdominal   Peds  Hematology negative hematology ROS (+)   Anesthesia Other Findings   Reproductive/Obstetrics                             Anesthesia Physical Anesthesia Plan  ASA: 2  Anesthesia Plan: General   Post-op Pain Management: Tylenol  PO (pre-op)* and Toradol  IV (intra-op)*   Induction: Intravenous  PONV Risk Score and Plan: 3 and Midazolam , Dexamethasone , Ondansetron  and Treatment may vary due to age or medical condition  Airway Management Planned: LMA  Additional Equipment: None  Intra-op Plan:   Post-operative Plan: Extubation in OR  Informed Consent: I have reviewed the patients History and Physical, chart, labs and discussed the procedure including the risks, benefits and alternatives for the proposed anesthesia with the patient or authorized representative who has indicated his/her understanding and acceptance.     Dental advisory given  Plan Discussed with: CRNA  Anesthesia Plan Comments:        Anesthesia Quick Evaluation

## 2024-05-15 NOTE — Progress Notes (Signed)
 Dr. FABIENE Needle is aware that pt hs a wedding ring on r hand that cannot be removed.

## 2024-05-15 NOTE — Anesthesia Postprocedure Evaluation (Signed)
 Anesthesia Post Note  Patient: Jennifer Casey  Procedure(s) Performed: ARTHROSCOPY, KNEE, WITH MENISCUS REPAIR (Right: Knee)     Patient location during evaluation: PACU Anesthesia Type: General Level of consciousness: awake and alert Pain management: pain level controlled Vital Signs Assessment: post-procedure vital signs reviewed and stable Respiratory status: spontaneous breathing, nonlabored ventilation, respiratory function stable and patient connected to nasal cannula oxygen Cardiovascular status: blood pressure returned to baseline and stable Postop Assessment: no apparent nausea or vomiting Anesthetic complications: no  There were no known notable events for this encounter.  Last Vitals:  Vitals:   05/15/24 1530 05/15/24 1545  BP: 122/65 128/69  Pulse: 75 78  Resp: 14 14  Temp:    SpO2: 92% 93%    Last Pain:  Vitals:   05/15/24 1530  TempSrc:   PainSc: Harlow Epifanio Lamar FORBES

## 2024-05-16 ENCOUNTER — Encounter: Payer: Self-pay | Admitting: Orthopedic Surgery

## 2024-05-18 ENCOUNTER — Encounter (HOSPITAL_COMMUNITY): Payer: Self-pay | Admitting: Orthopedic Surgery

## 2024-05-21 ENCOUNTER — Encounter (HOSPITAL_COMMUNITY): Payer: Self-pay | Admitting: Orthopedic Surgery

## 2024-05-22 ENCOUNTER — Encounter: Admitting: Surgical

## 2024-05-23 ENCOUNTER — Ambulatory Visit (INDEPENDENT_AMBULATORY_CARE_PROVIDER_SITE_OTHER): Admitting: Physician Assistant

## 2024-05-23 DIAGNOSIS — Z9889 Other specified postprocedural states: Secondary | ICD-10-CM

## 2024-05-23 NOTE — Progress Notes (Signed)
 Post-Op Visit Note   Patient: Jennifer Casey           Date of Birth: 1966-06-25           MRN: [id number] Visit Date: 05/23/2024 PCP: Geofm Glade PARAS, MD   Assessment & Plan:  Chief Complaint:  Chief Complaint  Patient presents with   Right Knee - Routine Post Op    RIGHT KNEE SCOPE (surgery date 05-07-24)   Visit Diagnoses:  1. S/P right knee arthroscopy     Plan: Patient is a pleasant 58 year old female who comes in today 2 weeks status post right knee arthroscopic debridement medial meniscus by Dr. Addie, date of surgery 05/07/2024.  She has been doing relatively well.  She is taking over-the-counter medicine for pain.  She has been compliant wearing a knee immobilizer and has been primarily nonweightbearing to the right lower extremity.  Examination of the right knee reveals well-healing surgical portals with nylon sutures in place.  No evidence of infection or cellulitis.  Calves are soft nontender.  She is neurovascularly intact distally.  Today, sutures were removed and Steri-Strips applied.  CPM machine ordered for which she will start as soon as it arrives.  She will remain nonweightbearing for 3 additional weeks.  She will follow-up with Herlene or Dr. Addie in 3 weeks for recheck and likely the initiation of outpatient PT.  Call with any concerns or questions in the meantime.  Follow-Up Instructions: Return in about 3 weeks (around 06/13/2024) for with [facility] or Dr. Addie.   Orders:  No orders of the defined types were placed in this encounter.  No orders of the defined types were placed in this encounter.   Imaging: No new imaging  PMFS History: Patient Active Problem List   Diagnosis Date Noted   Dystrophy of vulva 01/04/2023   Acute medial meniscus tear of right knee 11/04/2022   Acute medial meniscal injury of left knee 11/04/2022   Hyperglycemia 07/07/2022   Hyperlipidemia 04/29/2022   Seasonal allergies 04/29/2022   Multiple lipomas 04/29/2022   History of  migraine headaches 04/29/2022   Nephrolithiasis 04/29/2022   Fatty liver 04/28/2022   Vitamin D  deficiency 04/28/2022   Pain in joint of right shoulder 03/19/2022   Family history of neoplasm of ovary 07/19/2019   Lichen sclerosus et atrophicus 10/25/2016   Abnormality of gait 10/08/2015   Knee pain, bilateral 05/08/2009   Past Medical History:  Diagnosis Date   Arthritis    DVT (deep venous thrombosis) (HCC) 11/11/2022   left lower ext.   Family history of adverse reaction to anesthesia    mother had really nausea   Headache    none since menopause   History of kidney stones    passed stone    Family History  Problem Relation Age of Onset   Osteoarthritis Mother    Diabetes Father    Heart disease Father    Kidney disease Father    Hyperlipidemia Father    Ovarian cancer Sister 25   Breast cancer Neg Hx    BRCA 1/2 Neg Hx     Past Surgical History:  Procedure Laterality Date   COLONOSCOPY     DILATION AND CURETTAGE OF UTERUS     KNEE ARTHROSCOPY WITH MEDIAL MENISECTOMY Left 11/04/2022   Procedure: LEFT KNEE ARTHROSCOPY WITH PARTIAL MEDIAL MENISCECTOMY;  Surgeon: Vernetta Lonni GRADE, MD;  Location: [facility];  Service: Orthopedics;  Laterality: Left;   KNEE ARTHROSCOPY WITH MENISCAL REPAIR Right 05/15/2024  Procedure: ARTHROSCOPY, KNEE, WITH MENISCUS REPAIR;  Surgeon: Addie Cordella Hamilton, MD;  Location: [facility];  Service: Orthopedics;  Laterality: Right;  RIGHT KNEE ARTHROSCOPY, DEBRIDEMENT vs REPAIR MENISCUS   Social History   Occupational History   Not on file  Tobacco Use   Smoking status: Former    Current packs/day: 0.50    Types: Cigarettes    Passive exposure: Past   Smokeless tobacco: Never   Tobacco comments:    Smoke 2 yrs on college then quit per patient on 05/14/24  Vaping Use   Vaping status: Never Used  Substance and Sexual Activity   Alcohol use: Yes    Alcohol/week: 2.0 - 3.0 standard drinks of alcohol    Types: 2 - 3 Glasses of wine per week     Comment: occas. wine   Drug use: Never   Sexual activity: Yes    Partners: Male    Birth control/protection: Post-menopausal

## 2024-05-25 ENCOUNTER — Encounter: Payer: Self-pay | Admitting: Orthopedic Surgery

## 2024-05-29 NOTE — Telephone Encounter (Signed)
 Can you please verify? Thanks.

## 2024-06-13 ENCOUNTER — Encounter: Payer: Self-pay | Admitting: Orthopedic Surgery

## 2024-06-13 ENCOUNTER — Ambulatory Visit (INDEPENDENT_AMBULATORY_CARE_PROVIDER_SITE_OTHER): Admitting: Orthopedic Surgery

## 2024-06-13 DIAGNOSIS — Z9889 Other specified postprocedural states: Secondary | ICD-10-CM

## 2024-06-13 NOTE — Progress Notes (Unsigned)
 Post-Op Visit Note   Patient: Jennifer Casey           Date of Birth: Oct 24, 1966           MRN: [id number] Visit Date: 06/13/2024 PCP: Geofm Glade PARAS, MD   Assessment & Plan:  Chief Complaint:  Chief Complaint  Patient presents with   Right Knee - Routine Post Op   Visit Diagnoses: No diagnosis found.  Plan: Jennifer Casey is a 58 year old patient is now about 4 weeks out from right knee arthroscopy with meniscal root repair.  She is at 90 on the CPM machine.  Has been nonweightbearing.  On examination no calf tenderness negative Homans.  Not much of an effusion with easy flexion and 90.  Plan at this time is to transition to weightbearing as tolerated over the next 2 weeks.  Like for her to be off crutches within the next few days.  Okay to bend the knee past 90 degrees but no loaded flexion beyond 90 degrees.  Did send her trainer note for her to be working on some leg extension and leg curl exercises with no loaded flexion or resistance work with the knee flexed past 90.  Stationary bike should be okay as well.  See her back in 4 weeks just for clinical recheck and see how much effusion she has.  Follow-Up Instructions: No follow-ups on file.   Orders:  No orders of the defined types were placed in this encounter.  No orders of the defined types were placed in this encounter.   Imaging: No results found.  PMFS History: Patient Active Problem List   Diagnosis Date Noted   Dystrophy of vulva 01/04/2023   Acute medial meniscus tear of right knee 11/04/2022   Acute medial meniscal injury of left knee 11/04/2022   Hyperglycemia 07/07/2022   Hyperlipidemia 04/29/2022   Seasonal allergies 04/29/2022   Multiple lipomas 04/29/2022   History of migraine headaches 04/29/2022   Nephrolithiasis 04/29/2022   Fatty liver 04/28/2022   Vitamin D  deficiency 04/28/2022   Pain in joint of right shoulder 03/19/2022   Family history of neoplasm of ovary 07/19/2019   Lichen sclerosus et  atrophicus 10/25/2016   Abnormality of gait 10/08/2015   Knee pain, bilateral 05/08/2009   Past Medical History:  Diagnosis Date   Arthritis    DVT (deep venous thrombosis) (HCC) 11/11/2022   left lower ext.   Family history of adverse reaction to anesthesia    mother had really nausea   Headache    none since menopause   History of kidney stones    passed stone    Family History  Problem Relation Age of Onset   Osteoarthritis Mother    Diabetes Father    Heart disease Father    Kidney disease Father    Hyperlipidemia Father    Ovarian cancer Sister 82   Breast cancer Neg Hx    BRCA 1/2 Neg Hx     Past Surgical History:  Procedure Laterality Date   COLONOSCOPY     DILATION AND CURETTAGE OF UTERUS     KNEE ARTHROSCOPY WITH MEDIAL MENISECTOMY Left 11/04/2022   Procedure: LEFT KNEE ARTHROSCOPY WITH PARTIAL MEDIAL MENISCECTOMY;  Surgeon: Vernetta Lonni GRADE, MD;  Location: [facility];  Service: Orthopedics;  Laterality: Left;   KNEE ARTHROSCOPY WITH MENISCAL REPAIR Right 05/15/2024   Procedure: ARTHROSCOPY, KNEE, WITH MENISCUS REPAIR;  Surgeon: Addie Cordella Hamilton, MD;  Location: [facility] OR;  Service: Orthopedics;  Laterality: Right;  RIGHT  KNEE ARTHROSCOPY, DEBRIDEMENT vs REPAIR MENISCUS   Social History   Occupational History   Not on file  Tobacco Use   Smoking status: Former    Current packs/day: 0.50    Types: Cigarettes    Passive exposure: Past   Smokeless tobacco: Never   Tobacco comments:    Smoke 2 yrs on college then quit per patient on 05/14/24  Vaping Use   Vaping status: Never Used  Substance and Sexual Activity   Alcohol use: Yes    Alcohol/week: 2.0 - 3.0 standard drinks of alcohol    Types: 2 - 3 Glasses of wine per week    Comment: occas. wine   Drug use: Never   Sexual activity: Yes    Partners: Male    Birth control/protection: Post-menopausal

## 2024-06-14 ENCOUNTER — Encounter: Payer: Self-pay | Admitting: Orthopedic Surgery

## 2024-07-11 ENCOUNTER — Ambulatory Visit (INDEPENDENT_AMBULATORY_CARE_PROVIDER_SITE_OTHER): Admitting: Orthopedic Surgery

## 2024-07-11 ENCOUNTER — Encounter: Payer: Self-pay | Admitting: Orthopedic Surgery

## 2024-07-11 DIAGNOSIS — Z9889 Other specified postprocedural states: Secondary | ICD-10-CM

## 2024-07-11 NOTE — Progress Notes (Signed)
 Post-Op Visit Note   Patient: Jennifer Casey           Date of Birth: [birth year]           MRN: [id number] Visit Date: 07/11/2024 PCP: Geofm Glade PARAS, MD   Assessment & Plan:  Chief Complaint:  Chief Complaint  Patient presents with   Right Knee - Routine Post Op   Visit Diagnoses:  1. S/P right knee arthroscopy     Plan: Jennifer Casey is now 8 weeks out right knee arthroscopy with meniscal root repair.  She is doing the indoor bike as well as stationary bike.  Walking outside every day for the past 5 days.  Does about 1 to 1-1/2 miles.  Has a little bit of discomfort when she is walking.  States she is having little bit of lateral ankle pain as well but no mechanical symptoms.  She is using resistance bands and doing body weight type exercises with her physical trainer which includes calf raises.  Going to [place] in 3 weeks.  On examination no effusion and excellent range of motion from full extension to easy flexion past 90.  Still has a little bit of Swelling about a centimeter on the right compared to the left.  Negative Homans.  Having some occasional calf pain when walking.  Plan at this time is to avoid bent knee loading beyond 90 degrees of flexion.  Ultrasound right lower extremity to rule out DVT.  23-month return for final check.  If she is getting a little discomfort in her knee she may be doing a little bit too much.  Safest exercises for her would be stationary bike/road bike.  Follow-Up Instructions: Return in about 2 months (around 09/10/2024).   Orders:  Orders Placed This Encounter  Procedures   VAS US  LOWER EXTREMITY VENOUS (DVT)   No orders of the defined types were placed in this encounter.   Imaging: No results found.  PMFS History: Patient Active Problem List   Diagnosis Date Noted   Dystrophy of vulva 01/04/2023   Acute medial meniscus tear of right knee 11/04/2022   Acute medial meniscal injury of left knee 11/04/2022   Hyperglycemia 07/07/2022    Hyperlipidemia 04/29/2022   Seasonal allergies 04/29/2022   Multiple lipomas 04/29/2022   History of migraine headaches 04/29/2022   Nephrolithiasis 04/29/2022   Fatty liver 04/28/2022   Vitamin D  deficiency 04/28/2022   Pain in joint of right shoulder 03/19/2022   Family history of neoplasm of ovary 07/19/2019   Lichen sclerosus et atrophicus 10/25/2016   Abnormality of gait 10/08/2015   Knee pain, bilateral 05/08/2009   Past Medical History:  Diagnosis Date   Arthritis    DVT (deep venous thrombosis) (HCC) 11/11/2022   left lower ext.   Family history of adverse reaction to anesthesia    mother had really nausea   Headache    none since menopause   History of kidney stones    passed stone    Family History  Problem Relation Age of Onset   Osteoarthritis Mother    Diabetes Father    Heart disease Father    Kidney disease Father    Hyperlipidemia Father    Ovarian cancer Sister 39   Breast cancer Neg Hx    BRCA 1/2 Neg Hx     Past Surgical History:  Procedure Laterality Date   COLONOSCOPY     DILATION AND CURETTAGE OF UTERUS     KNEE ARTHROSCOPY WITH MEDIAL MENISECTOMY  Left 11/04/2022   Procedure: LEFT KNEE ARTHROSCOPY WITH PARTIAL MEDIAL MENISCECTOMY;  Surgeon: Vernetta Lonni GRADE, MD;  Location: [facility];  Service: Orthopedics;  Laterality: Left;   KNEE ARTHROSCOPY WITH MENISCAL REPAIR Right 05/15/2024   Procedure: ARTHROSCOPY, KNEE, WITH MENISCUS REPAIR;  Surgeon: Addie Cordella Hamilton, MD;  Location: [facility] OR;  Service: Orthopedics;  Laterality: Right;  RIGHT KNEE ARTHROSCOPY, DEBRIDEMENT vs REPAIR MENISCUS   Social History   Occupational History   Not on file  Tobacco Use   Smoking status: Former    Current packs/day: 0.50    Types: Cigarettes    Passive exposure: Past   Smokeless tobacco: Never   Tobacco comments:    Smoke 2 yrs on college then quit per patient on 05/14/24  Vaping Use   Vaping status: Never Used  Substance and Sexual Activity   Alcohol  use: Yes    Alcohol/week: 2.0 - 3.0 standard drinks of alcohol    Types: 2 - 3 Glasses of wine per week    Comment: occas. wine   Drug use: Never   Sexual activity: Yes    Partners: Male    Birth control/protection: Post-menopausal

## 2024-07-12 ENCOUNTER — Ambulatory Visit (HOSPITAL_COMMUNITY)
Admission: RE | Admit: 2024-07-12 | Discharge: 2024-07-12 | Disposition: A | Discharge status: Home / Self Care | Source: Ambulatory Visit | Attending: Orthopedic Surgery | Admitting: Orthopedic Surgery

## 2024-07-12 DIAGNOSIS — Z9889 Other specified postprocedural states: Secondary | ICD-10-CM | POA: Diagnosis not present

## 2024-08-16 ENCOUNTER — Encounter: Payer: Self-pay | Admitting: Orthopedic Surgery

## 2024-08-16 NOTE — Telephone Encounter (Signed)
 Hi Lauren can you have her come in next week sometime.  Thanks

## 2024-08-22 ENCOUNTER — Ambulatory Visit: Admitting: Orthopedic Surgery

## 2024-08-22 DIAGNOSIS — Z9889 Other specified postprocedural states: Secondary | ICD-10-CM

## 2024-08-23 NOTE — Progress Notes (Signed)
 Post-Op Visit Note   Patient: Jennifer Casey           Date of Birth: 1966/08/07           MRN: [id number] Visit Date: 08/22/2024 PCP: Geofm Glade PARAS, MD   Assessment & Plan:  Chief Complaint:  Chief Complaint  Patient presents with   Right Knee - Pain   Visit Diagnoses:  1. S/P right knee arthroscopy     Plan: Mariaelena is now about 3 months out from right knee arthroscopy and meniscal root repair.  She did have a trip to [place] in early September and she was okay for the first part of the trip.  The second part of the trip was tougher because of a lot of hills she had to walk.  She did use a cane.  The flight home was also difficult.  She is having some start of pain and ambulated with a limp.  The flight was 9-1/2 hours.  She rested for a couple days after that.  Walking is becoming a little bit more difficult.  Has been improving over the last 5 days.  On exam she has trace effusion with good range of motion and intact extensor mechanism.  No calf tenderness.  Quad strength is improving.  Plan at this time is aspiration and injection of Toradol  into the knee.  We did get out about 20 cc.  3-week return for clinical recheck.  I think she may have overexerted the knee and this would hopefully just t the knee so she can resume her activities of daily living including nonweightbearing strengthening exercises. Follow-Up Instructions: No follow-ups on file.   Orders:  No orders of the defined types were placed in this encounter.  No orders of the defined types were placed in this encounter.   Imaging: No results found.  PMFS History: Patient Active Problem List   Diagnosis Date Noted   Dystrophy of vulva 01/04/2023   Acute medial meniscus tear of right knee 11/04/2022   Acute medial meniscal injury of left knee 11/04/2022   Hyperglycemia 07/07/2022   Hyperlipidemia 04/29/2022   Seasonal allergies 04/29/2022   Multiple lipomas 04/29/2022   History of migraine headaches  04/29/2022   Nephrolithiasis 04/29/2022   Fatty liver 04/28/2022   Vitamin D  deficiency 04/28/2022   Pain in joint of right shoulder 03/19/2022   Family history of neoplasm of ovary 07/19/2019   Lichen sclerosus et atrophicus 10/25/2016   Abnormality of gait 10/08/2015   Knee pain, bilateral 05/08/2009   Past Medical History:  Diagnosis Date   Arthritis    DVT (deep venous thrombosis) (HCC) 11/11/2022   left lower ext.   Family history of adverse reaction to anesthesia    mother had really nausea   Headache    none since menopause   History of kidney stones    passed stone    Family History  Problem Relation Age of Onset   Osteoarthritis Mother    Diabetes Father    Heart disease Father    Kidney disease Father    Hyperlipidemia Father    Ovarian cancer Sister 80   Breast cancer Neg Hx    BRCA 1/2 Neg Hx     Past Surgical History:  Procedure Laterality Date   COLONOSCOPY     DILATION AND CURETTAGE OF UTERUS     KNEE ARTHROSCOPY WITH MEDIAL MENISECTOMY Left 11/04/2022   Procedure: LEFT KNEE ARTHROSCOPY WITH PARTIAL MEDIAL MENISCECTOMY;  Surgeon: Vernetta Lonni GRADE,  MD;  Location: [facility];  Service: Orthopedics;  Laterality: Left;   KNEE ARTHROSCOPY WITH MENISCAL REPAIR Right 05/15/2024   Procedure: ARTHROSCOPY, KNEE, WITH MENISCUS REPAIR;  Surgeon: Addie Cordella Hamilton, MD;  Location: [facility] OR;  Service: Orthopedics;  Laterality: Right;  RIGHT KNEE ARTHROSCOPY, DEBRIDEMENT vs REPAIR MENISCUS   Social History   Occupational History   Not on file  Tobacco Use   Smoking status: Former    Current packs/day: 0.50    Types: Cigarettes    Passive exposure: Past   Smokeless tobacco: Never   Tobacco comments:    Smoke 2 yrs on college then quit per patient on 05/14/24  Vaping Use   Vaping status: Never Used  Substance and Sexual Activity   Alcohol use: Yes    Alcohol/week: 2.0 - 3.0 standard drinks of alcohol    Types: 2 - 3 Glasses of wine per week    Comment: occas.  wine   Drug use: Never   Sexual activity: Yes    Partners: Male    Birth control/protection: Post-menopausal

## 2024-08-24 ENCOUNTER — Encounter: Payer: Self-pay | Admitting: Orthopedic Surgery

## 2024-08-25 ENCOUNTER — Other Ambulatory Visit: Payer: Self-pay | Admitting: Orthopedic Surgery

## 2024-08-26 ENCOUNTER — Encounter: Payer: Self-pay | Admitting: Orthopedic Surgery

## 2024-09-12 ENCOUNTER — Ambulatory Visit: Admitting: Orthopedic Surgery

## 2024-09-12 ENCOUNTER — Encounter: Payer: Self-pay | Admitting: Orthopedic Surgery

## 2024-09-12 DIAGNOSIS — Z9889 Other specified postprocedural states: Secondary | ICD-10-CM

## 2024-09-12 NOTE — Progress Notes (Unsigned)
 Post-Op Visit Note   Patient: Jennifer Casey           Date of Birth: Jan 24, 1966           MRN: [id number] Visit Date: 09/12/2024 PCP: Geofm Glade PARAS, MD   Assessment & Plan:  Chief Complaint:  Chief Complaint  Patient presents with   Other    Recheck knee s/p toradol  injection    Visit Diagnoses:  1. S/P right knee arthroscopy     Plan: Diani is now 4 months out right knee arthroscopy with meniscal root repair.  She did have an aspiration and injection with Toradol  on 08/22/2024 which gave her about 1 week of relief.  She had to get back to using a cane.  She has been to [place] since she was last seen.  She has a exercise including 3 days a week with a trainer which does include biking.  Her walking endurance is around 1/4-1/2 a mile.  It is hard for her to stand longer than 10 to 15 minutes.  Twisting is also painful at times.  On exam she has excellent range of motion with mild effusion.  Not too much in terms of focal joint line tenderness.  No crepitus or popping in the knee.  Plan at this time is to preapproved gel injection to do in 2 months.  Thanks okay for her to start going up and down stairs.  Continue with primarily nonweightbearing quad strengthening exercises but I do want her to try to increase her walking and standing endurance as her leg get stronger.  Follow-Up Instructions: No follow-ups on file.  This patient is diagnosed with osteoarthritis of the knee(s).    Radiographs show evidence of joint space narrowing, osteophytes, subchondral sclerosis and/or subchondral cysts.  This patient has knee pain which interferes with functional and activities of daily living.    This patient has experienced inadequate response, adverse effects and/or intolerance with conservative treatments such as acetaminophen , NSAIDS, topical creams, physical therapy or regular exercise, knee bracing and/or weight loss.   This patient has experienced inadequate response or has a  contraindication to intra articular steroid injections for at least 3 months.   This patient is not scheduled to have a total knee replacement within 6 months of starting treatment with viscosupplementation.  Orders:  Orders Placed This Encounter  Procedures   Ambulatory request for injection medication   No orders of the defined types were placed in this encounter.   Imaging: No results found.  PMFS History: Patient Active Problem List   Diagnosis Date Noted   Dystrophy of vulva 01/04/2023   Acute medial meniscus tear of right knee 11/04/2022   Acute medial meniscal injury of left knee 11/04/2022   Hyperglycemia 07/07/2022   Hyperlipidemia 04/29/2022   Seasonal allergies 04/29/2022   Multiple lipomas 04/29/2022   History of migraine headaches 04/29/2022   Nephrolithiasis 04/29/2022   Fatty liver 04/28/2022   Vitamin D  deficiency 04/28/2022   Pain in joint of right shoulder 03/19/2022   Family history of neoplasm of ovary 07/19/2019   Lichen sclerosus et atrophicus 10/25/2016   Abnormality of gait 10/08/2015   Knee pain, bilateral 05/08/2009   Past Medical History:  Diagnosis Date   Arthritis    DVT (deep venous thrombosis) (HCC) 11/11/2022   left lower ext.   Family history of adverse reaction to anesthesia    mother had really nausea   Headache    none since menopause   History of  kidney stones    passed stone    Family History  Problem Relation Age of Onset   Osteoarthritis Mother    Diabetes Father    Heart disease Father    Kidney disease Father    Hyperlipidemia Father    Ovarian cancer Sister 72   Breast cancer Neg Hx    BRCA 1/2 Neg Hx     Past Surgical History:  Procedure Laterality Date   COLONOSCOPY     DILATION AND CURETTAGE OF UTERUS     KNEE ARTHROSCOPY WITH MEDIAL MENISECTOMY Left 11/04/2022   Procedure: LEFT KNEE ARTHROSCOPY WITH PARTIAL MEDIAL MENISCECTOMY;  Surgeon: Vernetta Lonni GRADE, MD;  Location: [facility];  Service: Orthopedics;   Laterality: Left;   KNEE ARTHROSCOPY WITH MENISCAL REPAIR Right 05/15/2024   Procedure: ARTHROSCOPY, KNEE, WITH MENISCUS REPAIR;  Surgeon: Addie Cordella Hamilton, MD;  Location: [facility] OR;  Service: Orthopedics;  Laterality: Right;  RIGHT KNEE ARTHROSCOPY, DEBRIDEMENT vs REPAIR MENISCUS   Social History   Occupational History   Not on file  Tobacco Use   Smoking status: Former    Current packs/day: 0.50    Types: Cigarettes    Passive exposure: Past   Smokeless tobacco: Never   Tobacco comments:    Smoke 2 yrs on college then quit per patient on 05/14/24  Vaping Use   Vaping status: Never Used  Substance and Sexual Activity   Alcohol use: Yes    Alcohol/week: 2.0 - 3.0 standard drinks of alcohol    Types: 2 - 3 Glasses of wine per week    Comment: occas. wine   Drug use: Never   Sexual activity: Yes    Partners: Male    Birth control/protection: Post-menopausal

## 2024-09-24 ENCOUNTER — Encounter: Payer: Self-pay | Admitting: Radiology

## 2024-09-27 ENCOUNTER — Encounter: Payer: Self-pay | Admitting: Orthopedic Surgery

## 2024-09-27 ENCOUNTER — Encounter: Payer: Self-pay | Admitting: Internal Medicine

## 2024-10-22 ENCOUNTER — Encounter: Payer: Self-pay | Admitting: Internal Medicine

## 2024-10-28 ENCOUNTER — Encounter: Payer: Self-pay | Admitting: Internal Medicine

## 2024-10-28 NOTE — Progress Notes (Unsigned)
 Subjective:    Patient ID: Jennifer Casey, female    DOB: 1965-11-24, 58 y.o.   MRN: [id number]      HPI Jennifer Casey is here for a Physical exam and her chronic medical problems.   Left meniscus repair two years ago.  Right 95% meniscus tear - s/p surgery April of this year.  Right knee is still very painful and limits her mobility.  She is exercising regularly.  She sees orthopedic later today and will get them checked injection and hopefully that will help.   Medications and allergies reviewed with patient and updated if appropriate.  Current Outpatient Medications on File Prior to Visit  Medication Sig Dispense Refill   ibuprofen (ADVIL) 200 MG tablet Take 200 mg by mouth every 8 (eight) hours as needed for moderate pain.     loratadine (CLARITIN) 10 MG tablet Take 10 mg by mouth daily.     No current facility-administered medications on file prior to visit.    Review of Systems  Constitutional:  Negative for fever.  Eyes:  Negative for visual disturbance.  Respiratory:  Negative for cough, shortness of breath and wheezing.   Cardiovascular:  Negative for chest pain, palpitations and leg swelling.  Gastrointestinal:  Negative for abdominal pain, blood in stool, constipation and diarrhea.       No gerd  Genitourinary:  Negative for dysuria.  Musculoskeletal:  Positive for arthralgias (knees). Negative for back pain.  Skin:  Negative for rash.  Neurological:  Negative for light-headedness and headaches.  Psychiatric/Behavioral:  Negative for dysphoric mood. The patient is not nervous/anxious.        Objective:   Vitals:   10/29/24 1317  BP: 128/70  Pulse: 61  Temp: 98 F (36.7 C)  SpO2: 98%   Filed Weights   10/29/24 1317  Weight: 183 lb (83 kg)   Body mass index is 36.96 kg/m.  BP Readings from Last 3 Encounters:  10/29/24 128/70  05/15/24 128/69  01/04/23 124/82    Wt Readings from Last 3 Encounters:  10/29/24 183 lb (83 kg)  05/15/24 184 lb (83.5  kg)  05/07/24 189 lb 9.5 oz (86 kg)       Physical Exam Constitutional: She appears well-developed and well-nourished. No distress.  HENT:  Head: Normocephalic and atraumatic.  Right Ear: External ear normal. Normal ear canal and TM Left Ear: External ear normal.  Normal ear canal and TM Mouth/Throat: Oropharynx is clear and moist.  Eyes: Conjunctivae normal.  Neck: Neck supple. No tracheal deviation present. No thyromegaly present.  No carotid bruit  Cardiovascular: Normal rate, regular rhythm and normal heart sounds.   No murmur heard.  No edema. Pulmonary/Chest: Effort normal and breath sounds normal. No respiratory distress. She has no wheezes. She has no rales.  Breast: deferred   Abdominal: Soft. She exhibits no distension. There is no tenderness.  Lymphadenopathy: She has no cervical adenopathy.  Skin: Skin is warm and dry. She is not diaphoretic.  Psychiatric: She has a normal mood and affect. Her behavior is normal.     Lab Results  Component Value Date   WBC 4.4 04/11/2024   HGB 14.2 04/11/2024   HCT 41.1 04/11/2024   PLT 200 04/11/2024   GLUCOSE 122 (H) 04/11/2024   CHOL 245 (H) 07/12/2022   TRIG 114.0 07/12/2022   HDL 57.90 07/12/2022   LDLCALC 164 (H) 07/12/2022   ALT 12 07/12/2022   AST 13 07/12/2022   NA 140 04/11/2024  K 3.7 04/11/2024   CL 104 04/11/2024   CREATININE 0.90 04/11/2024   BUN 14 04/11/2024   CO2 27 04/11/2024   TSH 1.24 07/12/2022   HGBA1C 5.6 07/12/2022         Assessment & Plan:   Physical exam: Screening blood work  ordered Exercise  trainer 3/week, exercise bike 6 days a week Weight  obese Substance abuse  none   Reviewed recommended immunizations.   Health Maintenance  Topic Date Due   Hepatitis B Vaccines 19-59 Average Risk (1 of 3 - 19+ 3-dose series) Never done   Cervical Cancer Screening (HPV/Pap Cotest)  Never done   Pneumococcal Vaccine: 50+ Years (1 of 1 - PCV) Never done   Zoster Vaccines- Shingrix (1 of  2) 09/03/2016   Influenza Vaccine  06/22/2024   COVID-19 Vaccine (5 - 2025-26 season) 11/13/2024 (Originally 07/23/2024)   Mammogram  11/10/2025   Colonoscopy  04/15/2027   DTaP/Tdap/Td (3 - Td or Tdap) 06/02/2031   HPV VACCINES  Aged Out   Meningococcal B Vaccine  Aged Out   Hepatitis C Screening  Discontinued   HIV Screening  Discontinued          See Problem List for Assessment and Plan of chronic medical problems.

## 2024-10-28 NOTE — Patient Instructions (Addendum)
 Blood work was ordered.       Medications changes include :   None    A referral was ordered and someone will call you to schedule an appointment.     Return in about 1 year (around 10/29/2025) for Physical Exam.    [facility] [place] [phone]  [facility] [phone]  [facility] [phone]    Health Maintenance, Female Adopting a healthy lifestyle and getting preventive care are important in promoting health and wellness. Ask your health care provider about: The right schedule for you to have regular tests and exams. Things you can do on your own to prevent diseases and keep yourself healthy. What should I know about diet, weight, and exercise? Eat a healthy diet  Eat a diet that includes plenty of vegetables, fruits, low-fat dairy products, and lean protein. Do not eat a lot of foods that are high in solid fats, added sugars, or sodium. Maintain a healthy weight Body mass index (BMI) is used to identify weight problems. It estimates body fat based on height and weight. Your health care provider can help determine your BMI and help you achieve or maintain a healthy weight. Get regular exercise Get regular exercise. This is one of the most important things you can do for your health. Most adults should: Exercise for at least 150 minutes each week. The exercise should increase your heart rate and make you sweat (moderate-intensity exercise). Do strengthening exercises at least twice a week. This is in addition to the moderate-intensity exercise. Spend less time sitting. Even light physical activity can be beneficial. Watch cholesterol and blood lipids Have your blood tested for lipids and cholesterol at 58 years of age, then have this test every 5 years. Have your cholesterol levels checked more often if: Your lipid or cholesterol levels are high. You are older than 58 years of age. You are at high risk for  heart disease. What should I know about cancer screening? Depending on your health history and family history, you may need to have cancer screening at various ages. This may include screening for: Breast cancer. Cervical cancer. Colorectal cancer. Skin cancer. Lung cancer. What should I know about heart disease, diabetes, and high blood pressure? Blood pressure and heart disease High blood pressure causes heart disease and increases the risk of stroke. This is more likely to develop in people who have high blood pressure readings or are overweight. Have your blood pressure checked: Every 3-5 years if you are 31-44 years of age. Every year if you are 36 years old or older. Diabetes Have regular diabetes screenings. This checks your fasting blood sugar level. Have the screening done: Once every three years after age 25 if you are at a normal weight and have a low risk for diabetes. More often and at a younger age if you are overweight or have a high risk for diabetes. What should I know about preventing infection? Hepatitis B If you have a higher risk for hepatitis B, you should be screened for this virus. Talk with your health care provider to find out if you are at risk for hepatitis B infection. Hepatitis C Testing is recommended for: Everyone born from [birth year] through 1965. Anyone with known risk factors for hepatitis C. Sexually transmitted infections (STIs) Get screened for STIs, including gonorrhea and chlamydia, if: You are sexually active and are younger than 58 years of age. You are older than 58 years  of age and your health care provider tells you that you are at risk for this type of infection. Your sexual activity has changed since you were last screened, and you are at increased risk for chlamydia or gonorrhea. Ask your health care provider if you are at risk. Ask your health care provider about whether you are at high risk for HIV. Your health care provider may recommend a  prescription medicine to help prevent HIV infection. If you choose to take medicine to prevent HIV, you should first get tested for HIV. You should then be tested every 3 months for as long as you are taking the medicine. Pregnancy If you are about to stop having your period (premenopausal) and you may become pregnant, seek counseling before you get pregnant. Take 400 to 800 micrograms (mcg) of folic acid every day if you become pregnant. Ask for birth control (contraception) if you want to prevent pregnancy. Osteoporosis and menopause Osteoporosis is a disease in which the bones lose minerals and strength with aging. This can result in bone fractures. If you are 68 years old or older, or if you are at risk for osteoporosis and fractures, ask your health care provider if you should: Be screened for bone loss. Take a calcium or vitamin D  supplement to lower your risk of fractures. Be given hormone replacement therapy (HRT) to treat symptoms of menopause. Follow these instructions at home: Alcohol use Do not drink alcohol if: Your health care provider tells you not to drink. You are pregnant, may be pregnant, or are planning to become pregnant. If you drink alcohol: Limit how much you have to: 0-1 drink a day. Know how much alcohol is in your drink. In the [place] one drink equals one 12 oz bottle of beer (355 mL), one 5 oz glass of wine (148 mL), or one 1 oz glass of hard liquor (44 mL). Lifestyle Do not use any products that contain nicotine or tobacco. These products include cigarettes, chewing tobacco, and vaping devices, such as e-cigarettes. If you need help quitting, ask your health care provider. Do not use street drugs. Do not share needles. Ask your health care provider for help if you need support or information about quitting drugs. General instructions Schedule regular health, dental, and eye exams. Stay current with your vaccines. Tell your health care provider if: You often  feel depressed. You have ever been abused or do not feel safe at home. Summary Adopting a healthy lifestyle and getting preventive care are important in promoting health and wellness. Follow your health care provider's instructions about healthy diet, exercising, and getting tested or screened for diseases. Follow your health care provider's instructions on monitoring your cholesterol and blood pressure. This information is not intended to replace advice given to you by your health care provider. Make sure you discuss any questions you have with your health care provider. Document Revised: 03/30/2021 Document Reviewed: 03/30/2021 Elsevier Patient Education  2024 [employer].

## 2024-10-29 ENCOUNTER — Encounter: Payer: Self-pay | Admitting: Internal Medicine

## 2024-10-29 ENCOUNTER — Ambulatory Visit: Admitting: Orthopedic Surgery

## 2024-10-29 ENCOUNTER — Other Ambulatory Visit (HOSPITAL_COMMUNITY): Payer: Self-pay

## 2024-10-29 ENCOUNTER — Ambulatory Visit: Admitting: Internal Medicine

## 2024-10-29 ENCOUNTER — Ambulatory Visit: Payer: Self-pay | Admitting: Internal Medicine

## 2024-10-29 ENCOUNTER — Encounter: Admitting: Internal Medicine

## 2024-10-29 VITALS — BP 128/70 | HR 61 | Temp 98.0°F | Ht 59.0 in | Wt 188.0 lb

## 2024-10-29 DIAGNOSIS — Z0000 Encounter for general adult medical examination without abnormal findings: Secondary | ICD-10-CM

## 2024-10-29 DIAGNOSIS — E7800 Pure hypercholesterolemia, unspecified: Secondary | ICD-10-CM

## 2024-10-29 DIAGNOSIS — M1711 Unilateral primary osteoarthritis, right knee: Secondary | ICD-10-CM | POA: Diagnosis not present

## 2024-10-29 DIAGNOSIS — R739 Hyperglycemia, unspecified: Secondary | ICD-10-CM

## 2024-10-29 DIAGNOSIS — M25562 Pain in left knee: Secondary | ICD-10-CM | POA: Diagnosis not present

## 2024-10-29 DIAGNOSIS — S83249A Other tear of medial meniscus, current injury, unspecified knee, initial encounter: Secondary | ICD-10-CM

## 2024-10-29 DIAGNOSIS — E669 Obesity, unspecified: Secondary | ICD-10-CM | POA: Insufficient documentation

## 2024-10-29 DIAGNOSIS — E559 Vitamin D deficiency, unspecified: Secondary | ICD-10-CM | POA: Diagnosis not present

## 2024-10-29 DIAGNOSIS — G8929 Other chronic pain: Secondary | ICD-10-CM

## 2024-10-29 DIAGNOSIS — M25561 Pain in right knee: Secondary | ICD-10-CM | POA: Diagnosis not present

## 2024-10-29 LAB — COMPREHENSIVE METABOLIC PANEL WITH GFR
ALT: 10 U/L (ref 0–35)
AST: 15 U/L (ref 0–37)
Albumin: 4.5 g/dL (ref 3.5–5.2)
Alkaline Phosphatase: 63 U/L (ref 39–117)
BUN: 16 mg/dL (ref 6–23)
CO2: 29 meq/L (ref 19–32)
Calcium: 9.6 mg/dL (ref 8.4–10.5)
Chloride: 103 meq/L (ref 96–112)
Creatinine, Ser: 0.76 mg/dL (ref 0.40–1.20)
GFR: 86.54 mL/min (ref >60.00)
Glucose, Bld: 88 mg/dL (ref 70–99)
Potassium: 3.8 meq/L (ref 3.5–5.1)
Sodium: 142 meq/L (ref 135–145)
Total Bilirubin: 0.6 mg/dL (ref 0.2–1.2)
Total Protein: 7.3 g/dL (ref 6.0–8.3)

## 2024-10-29 LAB — CBC
HCT: 41 % (ref 36.0–46.0)
Hemoglobin: 14.1 g/dL (ref 12.0–15.0)
MCHC: 34.4 g/dL (ref 30.0–36.0)
MCV: 91.1 fl (ref 78.0–100.0)
Platelets: 218 K/uL (ref 150.0–400.0)
RBC: 4.5 Mil/uL (ref 3.87–5.11)
RDW: 12.8 % (ref 11.5–15.5)
WBC: 5.3 K/uL (ref 4.0–10.5)

## 2024-10-29 LAB — HEMOGLOBIN A1C: Hgb A1c MFr Bld: 5.3 % (ref 4.6–6.5)

## 2024-10-29 LAB — LIPID PANEL
Cholesterol: 237 mg/dL — ABNORMAL HIGH (ref 0–200)
HDL: 50.4 mg/dL (ref >39.00)
LDL Cholesterol: 162 mg/dL — ABNORMAL HIGH (ref 0–99)
NonHDL: 186.52
Total CHOL/HDL Ratio: 5
Triglycerides: 123 mg/dL (ref 0.0–149.0)
VLDL: 24.6 mg/dL (ref 0.0–40.0)

## 2024-10-29 LAB — TSH: TSH: 1.33 u[IU]/mL (ref 0.35–5.50)

## 2024-10-29 LAB — VITAMIN D 25 HYDROXY (VIT D DEFICIENCY, FRACTURES): VITD: 23.01 ng/mL — ABNORMAL LOW (ref 30.00–100.00)

## 2024-10-29 MED ORDER — HYALURONAN 88 MG/4ML IX SOSY
88.0000 mg | PREFILLED_SYRINGE | INTRA_ARTICULAR | Status: AC | PRN
  Administered 2024-10-29: 88 mg via INTRA_ARTICULAR

## 2024-10-29 MED ORDER — HYLAN G-F 20 48 MG/6ML IX SOSY
48.0000 mg | PREFILLED_SYRINGE | INTRA_ARTICULAR | Status: AC | PRN
  Administered 2024-10-29: 48 mg via INTRA_ARTICULAR

## 2024-10-29 MED ORDER — LIDOCAINE HCL 1 % IJ SOLN
5.0000 mL | INTRAMUSCULAR | Status: AC | PRN
  Administered 2024-10-29: 5 mL

## 2024-10-29 MED ORDER — FLUZONE 0.5 ML IM SUSY
0.5000 mL | PREFILLED_SYRINGE | Freq: Once | INTRAMUSCULAR | 0 refills | Status: AC
  Filled 2024-10-29: qty 0.5, 1d supply, fill #0

## 2024-10-29 MED ORDER — COVID-19 MRNA VAC-TRIS(PFIZER) 30 MCG/0.3ML IM SUSY
0.3000 mL | PREFILLED_SYRINGE | Freq: Once | INTRAMUSCULAR | 0 refills | Status: AC
  Filled 2024-10-29: qty 0.3, 1d supply, fill #0

## 2024-10-29 NOTE — Assessment & Plan Note (Addendum)
 Chronic Family history of diabetes-dad, sister Lab Results  Component Value Date   HGBA1C 5.6 07/12/2022   Check a1c, CMP, CBC, TSH Low sugar / carb diet Stressed regular exercise

## 2024-10-29 NOTE — Assessment & Plan Note (Addendum)
 Chronic Not taking any supplementation regularly Check vitamin D  level

## 2024-10-29 NOTE — Assessment & Plan Note (Signed)
 Chronic BMI 38.98 Working on weight loss Continue regular exercise Working on decreasing calorie intake

## 2024-10-29 NOTE — Assessment & Plan Note (Signed)
 Chronic S/p meniscus tear repair left knee 2023 S/p meniscus tear repair right knee 2025

## 2024-10-29 NOTE — Progress Notes (Signed)
   Procedure Note  Patient: Jennifer Casey             Date of Birth: 29-Oct-1966           MRN: [id number]             Visit Date: 10/29/2024  Procedures: Visit Diagnoses:  1. Acute radial tear of medial meniscus     Large Joint Inj: R knee on 10/29/2024 7:19 PM Indications: diagnostic evaluation, joint swelling and pain Details: 18 G 1.5 in needle, superolateral approach  Arthrogram: No  Medications: 5 mL lidocaine  1 %; 48 mg Hylan G-F 20 48 MG/6ML; 88 mg Hyaluronan 88 MG/4ML Outcome: tolerated well, no immediate complications Procedure, treatment alternatives, risks and benefits explained, specific risks discussed. Consent was given by the patient. Immediately prior to procedure a time out was called to verify the correct patient, procedure, equipment, support staff and site/side marked as required. Patient was prepped and draped in the usual sterile fashion.     Patient is making slow improvement.  Pain is better.  Working on strengthening 3 times a week with her trainer 6 days a week on the bike.  Still feels like her gait is off a little bit.  No groin pain.  69-month return for final check.  On exam she does have about a 3 to 4 degree flexion contracture in the right knee compared to about 0 to 1 degree on the left [address] [phone]

## 2024-10-29 NOTE — Assessment & Plan Note (Signed)
 Chronic Check lipid panel, CMP, TSH Lifestyle controlled Continue healthy lifestyle-regular exercise and healthy diet

## 2024-11-21 ENCOUNTER — Ambulatory Visit: Admitting: Orthopedic Surgery

## 2025-01-11 ENCOUNTER — Encounter: Admitting: Internal Medicine

## 2025-02-27 ENCOUNTER — Ambulatory Visit: Admitting: Orthopedic Surgery
